# Patient Record
Sex: Female | Born: 1980 | Race: Black or African American | Hispanic: No | Smoking: Current every day smoker
Health system: Southern US, Community
[De-identification: ages and names within clinical notes are randomized; demographics above are authoritative.]

## PROBLEM LIST (undated history)

## (undated) DIAGNOSIS — R011 Cardiac murmur, unspecified: Secondary | ICD-10-CM

## (undated) DIAGNOSIS — K859 Acute pancreatitis without necrosis or infection, unspecified: Secondary | ICD-10-CM

## (undated) DIAGNOSIS — O009 Unspecified ectopic pregnancy without intrauterine pregnancy: Secondary | ICD-10-CM

## (undated) DIAGNOSIS — J45909 Unspecified asthma, uncomplicated: Secondary | ICD-10-CM

## (undated) HISTORY — PX: TUBAL LIGATION: SHX77

## (undated) HISTORY — PX: LAPAROSCOPIC UNILATERAL SALPINGECTOMY: SHX5934

## (undated) HISTORY — PX: SALPINGOOPHORECTOMY: SHX82

## (undated) HISTORY — PX: OTHER SURGICAL HISTORY: SHX169

---

## 2003-12-05 ENCOUNTER — Emergency Department (HOSPITAL_COMMUNITY): Admission: EM | Admit: 2003-12-05 | Discharge: 2003-12-05 | Payer: Self-pay | Admitting: Emergency Medicine

## 2004-03-07 ENCOUNTER — Emergency Department (HOSPITAL_COMMUNITY): Admission: EM | Admit: 2004-03-07 | Discharge: 2004-03-07 | Payer: Self-pay | Admitting: Emergency Medicine

## 2004-11-14 ENCOUNTER — Emergency Department (HOSPITAL_COMMUNITY): Admission: EM | Admit: 2004-11-14 | Discharge: 2004-11-14 | Payer: Self-pay | Admitting: Emergency Medicine

## 2005-03-28 ENCOUNTER — Emergency Department (HOSPITAL_COMMUNITY): Admission: EM | Admit: 2005-03-28 | Discharge: 2005-03-28 | Payer: Self-pay | Admitting: Emergency Medicine

## 2005-05-22 ENCOUNTER — Emergency Department (HOSPITAL_COMMUNITY): Admission: EM | Admit: 2005-05-22 | Discharge: 2005-05-22 | Payer: Self-pay | Admitting: Emergency Medicine

## 2005-06-01 ENCOUNTER — Emergency Department (HOSPITAL_COMMUNITY): Admission: EM | Admit: 2005-06-01 | Discharge: 2005-06-01 | Payer: Self-pay | Admitting: Emergency Medicine

## 2005-08-22 ENCOUNTER — Inpatient Hospital Stay (HOSPITAL_COMMUNITY): Admission: AD | Admit: 2005-08-22 | Discharge: 2005-08-22 | Payer: Self-pay | Admitting: Family Medicine

## 2005-08-23 ENCOUNTER — Ambulatory Visit: Payer: Self-pay | Admitting: Family Medicine

## 2005-08-23 ENCOUNTER — Inpatient Hospital Stay (HOSPITAL_COMMUNITY): Admission: AD | Admit: 2005-08-23 | Discharge: 2005-08-24 | Payer: Self-pay | Admitting: Obstetrics & Gynecology

## 2005-09-23 ENCOUNTER — Encounter: Payer: Self-pay | Admitting: Family Medicine

## 2005-09-23 ENCOUNTER — Ambulatory Visit: Payer: Self-pay | Admitting: Family Medicine

## 2005-10-07 ENCOUNTER — Ambulatory Visit: Payer: Self-pay | Admitting: Family Medicine

## 2005-10-07 ENCOUNTER — Ambulatory Visit (HOSPITAL_COMMUNITY): Admission: RE | Admit: 2005-10-07 | Discharge: 2005-10-07 | Payer: Self-pay | Admitting: Obstetrics & Gynecology

## 2005-10-28 ENCOUNTER — Ambulatory Visit: Payer: Self-pay | Admitting: Family Medicine

## 2005-11-03 ENCOUNTER — Inpatient Hospital Stay (HOSPITAL_COMMUNITY): Admission: AD | Admit: 2005-11-03 | Discharge: 2005-11-05 | Payer: Self-pay | Admitting: Obstetrics & Gynecology

## 2005-11-03 ENCOUNTER — Ambulatory Visit: Payer: Self-pay | Admitting: *Deleted

## 2005-11-04 ENCOUNTER — Ambulatory Visit: Payer: Self-pay | Admitting: Neonatology

## 2005-11-11 ENCOUNTER — Ambulatory Visit: Payer: Self-pay | Admitting: Family Medicine

## 2005-11-11 ENCOUNTER — Ambulatory Visit: Payer: Self-pay | Admitting: Obstetrics & Gynecology

## 2005-11-11 ENCOUNTER — Observation Stay (HOSPITAL_COMMUNITY): Admission: AD | Admit: 2005-11-11 | Discharge: 2005-11-12 | Payer: Self-pay | Admitting: Obstetrics & Gynecology

## 2005-11-25 ENCOUNTER — Ambulatory Visit: Payer: Self-pay | Admitting: Family Medicine

## 2005-12-09 ENCOUNTER — Ambulatory Visit: Payer: Self-pay | Admitting: Family Medicine

## 2005-12-12 ENCOUNTER — Ambulatory Visit: Payer: Self-pay | Admitting: Certified Nurse Midwife

## 2005-12-12 ENCOUNTER — Inpatient Hospital Stay (HOSPITAL_COMMUNITY): Admission: AD | Admit: 2005-12-12 | Discharge: 2005-12-12 | Payer: Self-pay | Admitting: Obstetrics and Gynecology

## 2005-12-13 ENCOUNTER — Ambulatory Visit: Payer: Self-pay | Admitting: Obstetrics and Gynecology

## 2005-12-13 ENCOUNTER — Observation Stay (HOSPITAL_COMMUNITY): Admission: AD | Admit: 2005-12-13 | Discharge: 2005-12-13 | Payer: Self-pay | Admitting: Gynecology

## 2005-12-23 ENCOUNTER — Ambulatory Visit (HOSPITAL_COMMUNITY): Admission: RE | Admit: 2005-12-23 | Discharge: 2005-12-23 | Payer: Self-pay | Admitting: Obstetrics and Gynecology

## 2005-12-23 ENCOUNTER — Ambulatory Visit: Payer: Self-pay | Admitting: *Deleted

## 2005-12-31 ENCOUNTER — Inpatient Hospital Stay (HOSPITAL_COMMUNITY): Admission: AD | Admit: 2005-12-31 | Discharge: 2005-12-31 | Payer: Self-pay | Admitting: Obstetrics and Gynecology

## 2005-12-31 ENCOUNTER — Ambulatory Visit: Payer: Self-pay | Admitting: *Deleted

## 2006-01-06 ENCOUNTER — Ambulatory Visit: Payer: Self-pay | Admitting: Gynecology

## 2006-01-06 ENCOUNTER — Inpatient Hospital Stay (HOSPITAL_COMMUNITY): Admission: AD | Admit: 2006-01-06 | Discharge: 2006-01-08 | Payer: Self-pay | Admitting: Gynecology

## 2006-01-06 ENCOUNTER — Ambulatory Visit: Payer: Self-pay | Admitting: Obstetrics & Gynecology

## 2006-01-08 ENCOUNTER — Encounter (INDEPENDENT_AMBULATORY_CARE_PROVIDER_SITE_OTHER): Payer: Self-pay | Admitting: *Deleted

## 2006-07-12 ENCOUNTER — Emergency Department (HOSPITAL_COMMUNITY): Admission: EM | Admit: 2006-07-12 | Discharge: 2006-07-12 | Payer: Self-pay | Admitting: Emergency Medicine

## 2006-07-16 ENCOUNTER — Emergency Department (HOSPITAL_COMMUNITY): Admission: EM | Admit: 2006-07-16 | Discharge: 2006-07-16 | Payer: Self-pay | Admitting: Emergency Medicine

## 2006-08-11 IMAGING — US US OB FOLLOW-UP
1 series · 13 of 28 positions shown · non-contrast
Comparison: 10/07/05
 Number of Fetuses: 1
 Heart Rate:  139
 Movement:  Yes
 Breathing:  No
 Presentation:  Cephalic
 Placental Location:  Posterior
 Grade:  I
 Previa:  No
 Amniotic Fluid (subjective):  Normal
 Amniotic Fluid (objective):  14.0 cm AFI (5th -95th%ile = 9.2 – 23.1 cm for 29 wks)

CLINICAL DATA: 24-year-old female with 29 week 5 day gestation.  Contractions and pelvic pain.  Reported history of low-lying placenta.  

 OBSTETRICAL ULTRASOUND RE-EVALUATION:

[Series 1: us ob follow-up · 0.33mm/px · 13 of 30 slices shown]
[im 2/30]
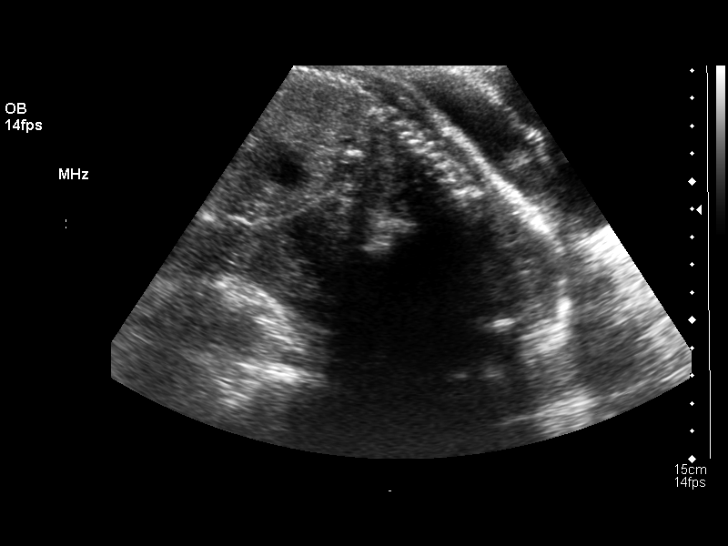
[im 4/30]
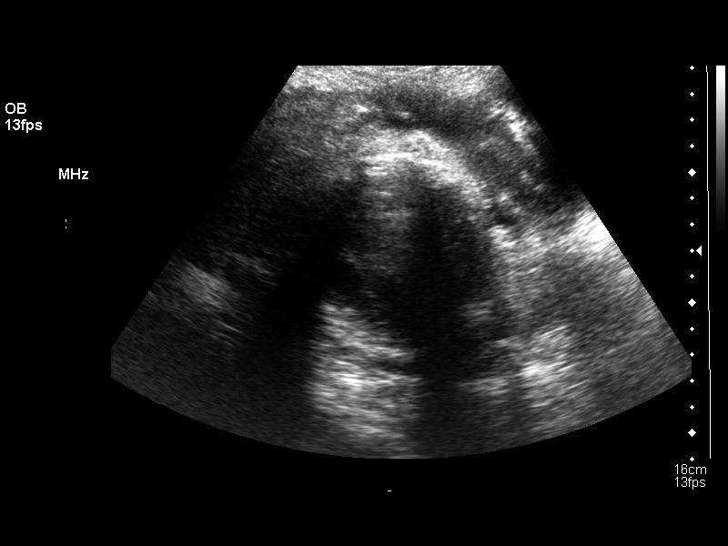
[im 6/30]
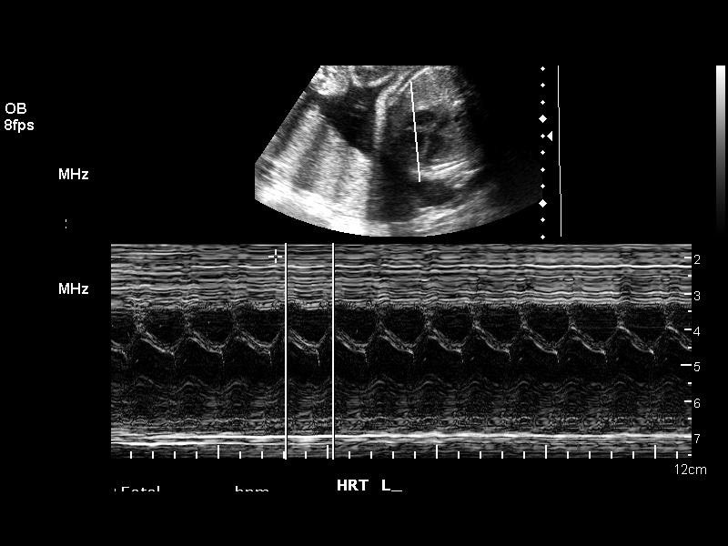
[im 8/30]
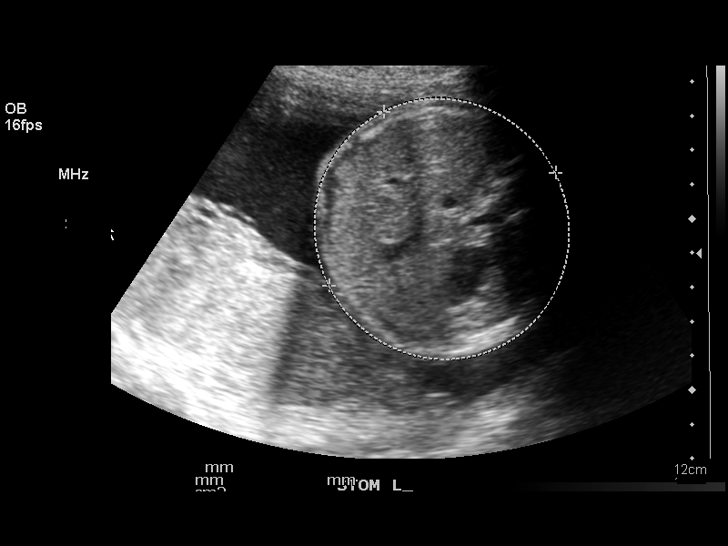
[im 10/30]
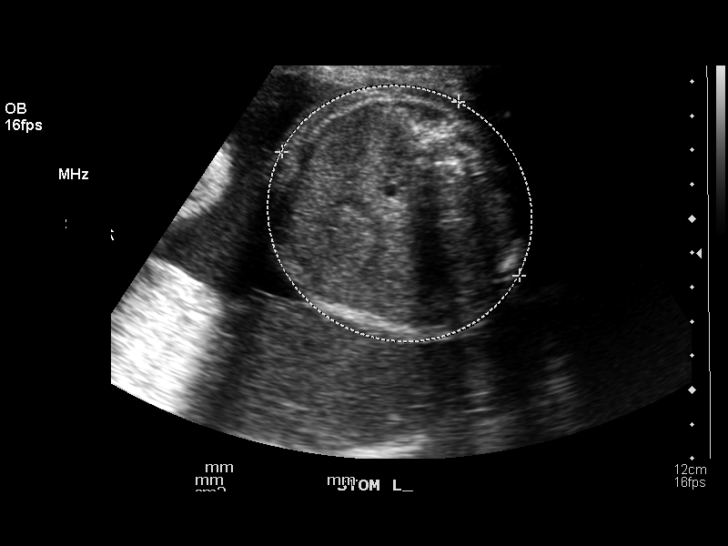
[im 12/30]
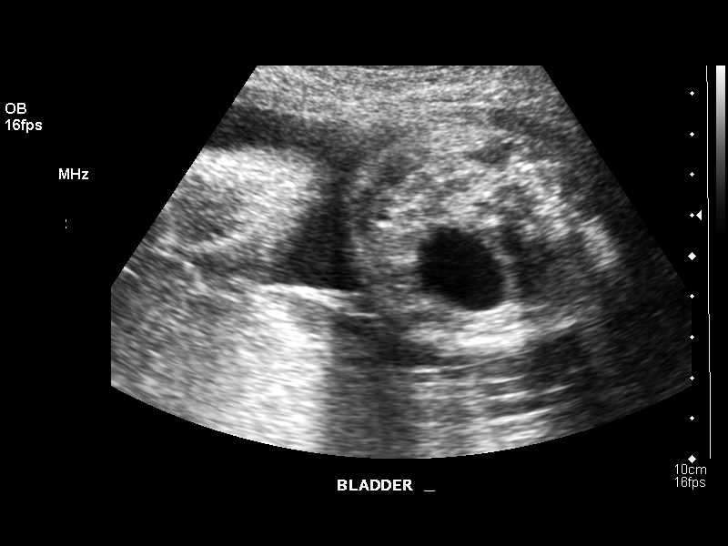
[im 16/30]
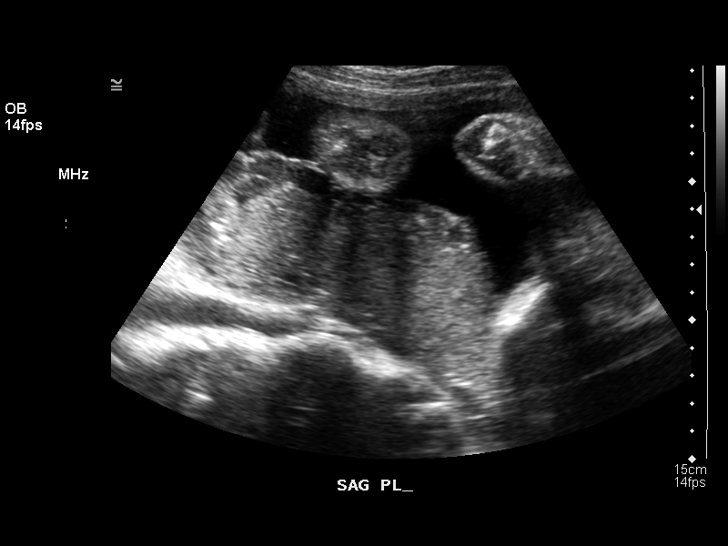
[im 18/30]
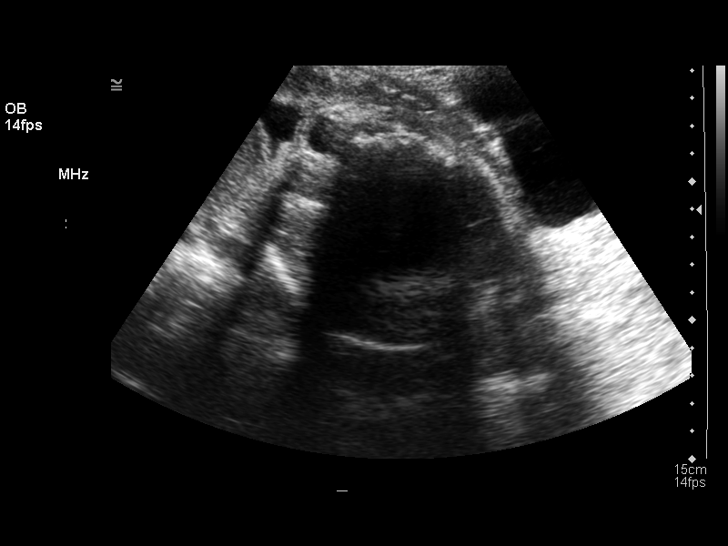
[im 20/30]
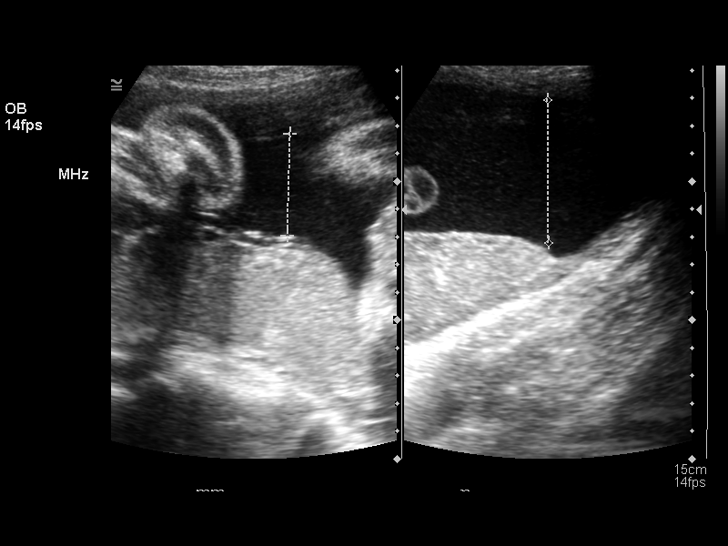
[im 22/30]
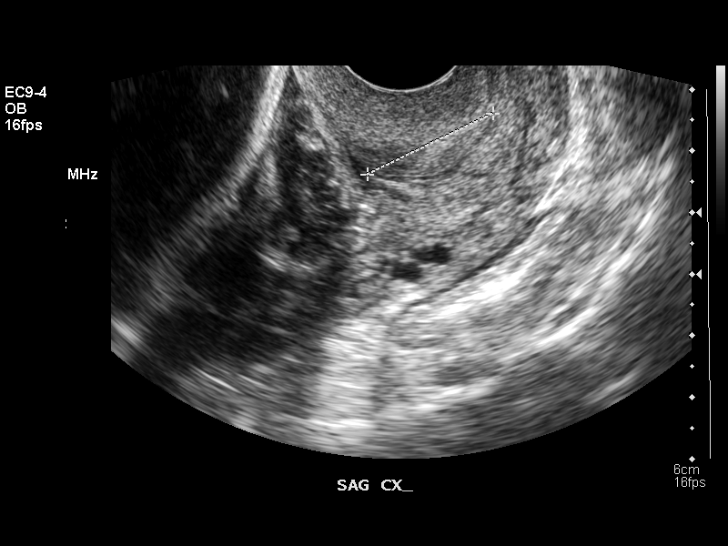
[im 24/30]
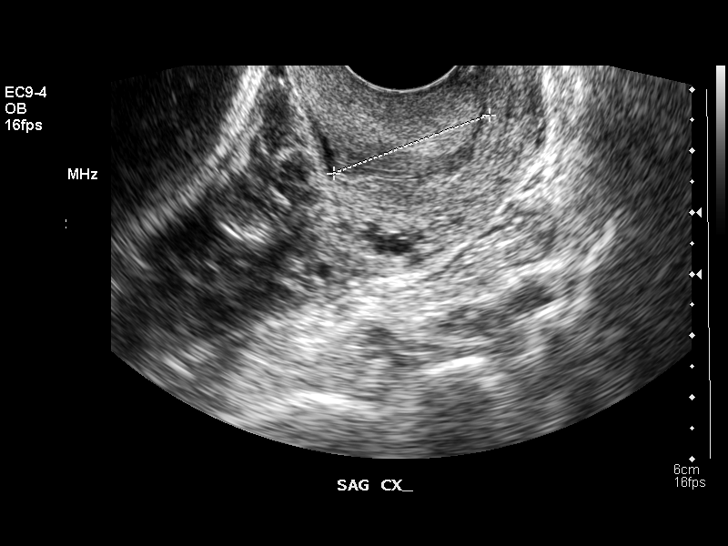
[im 26/30]
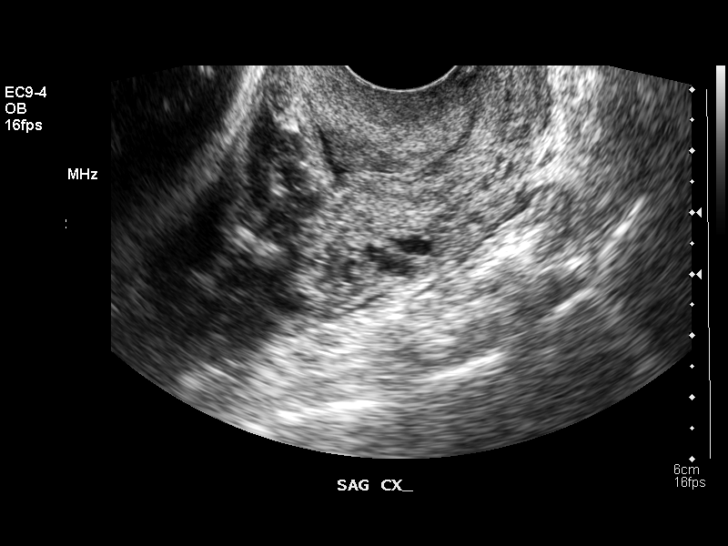
[im 28/30]
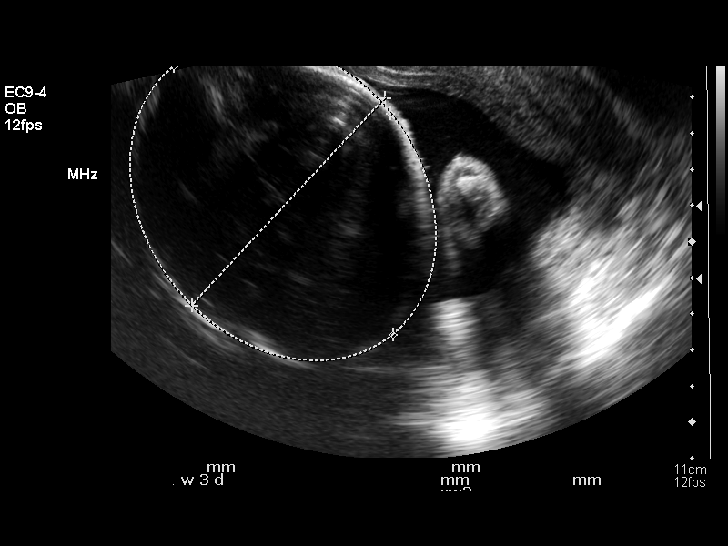

[13 of 28 positions shown; findings below may reference images not displayed]

FETAL BIOMETRY
 BPD:  6.4 cm   30 w 5 d
 HC:  27.1 cm   29 w 4 d
 AC:  23.7 cm   28 w 0 d
 FL:   5.3 cm   28 w 2 d

 Mean GA:  29 w 1 d  US EDC:  01/18/06
 Assigned GA:  28 w 5 d  Assigned EDC:  01/21/06

 EFW:  2224 g (H) 50th – 75th%ile (9900 – 4381 g) For 29 wks

 FETAL ANATOMY
 Lateral Ventricles:  Previously seen 
 Thalami/CSP:  Previously seen 
 Posterior Fossa:  Previously seen 
 Nuchal Region:  Previously seen 
 Spine:  Previously seen 
 4 Chamber Heart on Left:  Visualized 
 Stomach on Left:  Visualized 
 3 Vessel Cord:  Previously seen 
 Cord Insertion Site:  Previously seen 
 Kidneys:  Visualized 
 Bladder:  Visualized 
 Extremities:  Previously seen 

 MATERNAL UTERINE AND ADNEXAL FINDINGS
 Cervix: 2.7 cm Transvaginally
IMPRESSION: 1.  Single living intrauterine gestation in cephalic presentation with assigned gestational age of 28 weeks 5 days and estimated gestational age by this ultrasound of 29 weeks 1 day.  
 2.  Posterior placenta without evidence of previa.
 3.  Normal amniotic fluid volume.
 4.  Cervical length 2.7 cm.

## 2007-06-23 ENCOUNTER — Emergency Department (HOSPITAL_COMMUNITY): Admission: EM | Admit: 2007-06-23 | Discharge: 2007-06-23 | Payer: Self-pay | Admitting: Emergency Medicine

## 2008-11-21 ENCOUNTER — Emergency Department (HOSPITAL_COMMUNITY): Admission: EM | Admit: 2008-11-21 | Discharge: 2008-11-21 | Payer: Self-pay | Admitting: Emergency Medicine

## 2008-12-23 ENCOUNTER — Emergency Department (HOSPITAL_COMMUNITY): Admission: EM | Admit: 2008-12-23 | Discharge: 2008-12-23 | Payer: Self-pay | Admitting: Emergency Medicine

## 2010-09-02 LAB — POCT URINALYSIS DIP (DEVICE)
Glucose, UA: NEGATIVE mg/dL
Hgb urine dipstick: NEGATIVE
Urobilinogen, UA: 4 mg/dL — ABNORMAL HIGH (ref 0.0–1.0)
pH: 6.5 (ref 5.0–8.0)

## 2010-09-02 LAB — WET PREP, GENITAL: Yeast Wet Prep HPF POC: NONE SEEN

## 2010-09-02 LAB — POCT PREGNANCY, URINE: Preg Test, Ur: NEGATIVE

## 2010-10-12 NOTE — Op Note (Signed)
NAMECAYLAH, Natalie [name] NO.:  [id number]   MEDICAL RECORD NO.:  [id number]          PATIENT TYPE:  INP   LOCATION:  9106                          FACILITY:  [facility]   PHYSICIAN:  Ginger Carne, MD  DATE OF BIRTH:  September 03, 1980   DATE OF PROCEDURE:  01/08/2006  DATE OF DISCHARGE:  01/08/2006                               OPERATIVE REPORT   PREOPERATIVE DIAGNOSIS:  Desire for permanent sterilization.   POSTOPERATIVE DIAGNOSIS:  Desire for permanent sterilization.   PROCEDURE:  Postpartum bilateral tubal ligation using modified Pomeroy  technique.   SURGEON:  Blima Rich, M.D.   ASSISTANT:  Carolanne Grumbling, M.D.   ESTIMATED BLOOD LOSS:  Minimal.   ANESTHESIA:  General.   SPECIMENS:  Portions of bilateral tubes were sent to pathology.   PROCEDURE:  The patient was taken to the operating room where she was  placed under general anesthesia.  She was prepped and draped in the  usual sterile fashion.  An approximate 3-cm infraumbilical vertical skin  incision was made and carried through until the abdomen was entered.  Left fallopian tube was grasped with a Babcock and brought out of the  abdomen, followed out to the fimbriae.  Next, a loop of the mid portion  of the fallopian tube was ligated x2 and excised.  Ends of the fallopian  tube were cauterized.  Excellent hemostasis was maintained.  Fallopian  tube was returned to the abdomen.  Next, attention was turned to the  right side.  In a similar fashion, the right fallopian tube was grasped  with a Babcock clamp, brought out of the abdomen and followed out to the  fimbriae.  Next, an approximate 3-cm loop of fallopian tube was ligated  x2 and excised, and ends of the ligated fallopian tube were cauterized.  Excellent hemostasis was attained.  Fallopian tube was returned to the  abdomen.  Next, fascia was closed in a running fashion.  Skin was closed  with Monocryl.   The patient tolerated the procedure well.   There were no complications.    ______________________________  [name], M.D.      Ginger Carne, MD  Electronically Signed   TLW/MEDQ  D:  06/22/2006  T:  06/22/2006  Job:  [id number]

## 2010-10-12 NOTE — Discharge Summary (Signed)
[name], Natalie [name]               ACCOUNT NO.:  [id number]   MEDICAL RECORD NO.:  [id number]          PATIENT TYPE:  INP   LOCATION:  9156                          [facility]  [facility]   PHYSICIAN:  Tracy L. Mayford Knife, M.D.DATE OF BIRTH:  03-04-1981   DATE OF ADMISSION:  11/03/2005  DATE OF DISCHARGE:                                 DISCHARGE SUMMARY   ADDENDUM:  The patient will be discharged to home on Procardia XL 30 mg one  tablet p.o. q.12h.           ______________________________  Marc Morgans. Mayford Knife, M.D.     TLW/MEDQ  D:  11/05/2005  T:  11/05/2005  Job:  [id number]

## 2010-10-12 NOTE — Discharge Summary (Signed)
[name], [name]               ACCOUNT NO.:  [id number]   MEDICAL RECORD NO.:  [id number]          PATIENT TYPE:  INP   LOCATION:  9156                          [facility]   PHYSICIAN:  Tracy L. Mayford Knife, M.D.DATE OF BIRTH:  1981-05-10   DATE OF ADMISSION:  11/03/2005  DATE OF DISCHARGE:  11/05/2005                                 DISCHARGE SUMMARY   DISCHARGE ATTENDING:  Ginger Carne, MD.   REASON FOR ADMISSION:  Preterm labor.   DISCHARGE DIAGNOSES:  1.  A 28 week 6 day intrauterine pregnancy with preterm contractions.  2.  Bipolar.  3.  Permanent sterilization desired postpartum.  4.  Substance abuse.   LABORATORY DATA:  Fetal fibronectin negative, urine drug screen positive for  marijuana. Group B strep pending.   HOSPITAL COURSE:  The patient is a 30 year old, G6, P1-2-2-3 at 82 and [redacted]  weeks gestational age dated by her LMP consistent with a 19 week ultrasound  who presented with painful contractions. The patient was found to be dilated  1 cm, cervix felt to be approximately 2 cm long and -2 to -1 station. The  patient was having strong contractions every 2 minutes. She was given IV  fluids and started on Procardia, ampicillin and was admitted for  observation. The patient's contractions initially improved with the  Procardia but got more painful and was eventually switched to magnesium.  This was able to be discontinued and on date of discharge, her cervix was 1  cm long and high.   The patient is also noted to have bipolar. She is not interested in talking  with psychiatrist at this point. She feels that she has been stable for the  past three years. She admits to self medicating with marijuana. She does  feel like she has a supportive husband and his family is also supportive.   DISPOSITION:  Home.   FOLLOWUP:  High risk clinic November 11, 2005.   DIET:  Regular.   ACTIVITY:  Bed rest.   MEDICATIONS:  Resume home meds.     ______________________________  [name], M.D.     TLW/MEDQ  D:  11/05/2005  T:  11/05/2005  Job:  [id number]

## 2010-10-12 NOTE — Discharge Summary (Signed)
NAMEREBECCAH, [name]               ACCOUNT NO.:  [id number]   MEDICAL RECORD NO.:  [id number]          PATIENT TYPE:  INP   LOCATION:  9153                          [facility]   PHYSICIAN:  Lesly Dukes, M.D. DATE OF BIRTH:  1980/09/13   DATE OF ADMISSION:  08/23/2005  DATE OF DISCHARGE:  08/24/2005                                 DISCHARGE SUMMARY   HISTORY OF PRESENT ILLNESS:  The patient is a 30 year old, G6, P1-2-2-3 at  18-3/7 weeks who was seen in the MAU on March 29, and found to have a  marginal previa and had bleeding 2 days before bleeding which she states was  spotting on toilet paper x3 which resolved on its own.  The patient was  instructed on March 29, to stay and be admitted.  The patient had to leave  for child care issues and left AMA.  She returned on March 30, to be  admitted.   MEDICATIONS:  1.  Prenatal vitamins.  2.  Albuterol.   PAST OBSTETRICAL HISTORY:  G1 was an ectopic with methotrexate; G2 was a  spontaneous AB at 12 weeks; G3 was a 42-week, 5 pound 4 ounce, SVD.  The  patient states she had PPROM at 6 months, but on bed rest at home; G4 was a  34-week SVD at 5 pounds 5.5 ounces.  The patient received steroids and  tocolytics at [facility]; G5 was a 36-week, 6 pound 13 ounce  SVD, bed rest after 12 weeks for bleeding.  The patient states that with all  three children she had placenta previa that resolved by the time she  delivered.   PAST GYNECOLOGICAL HISTORY:  Trichomonas in the past.   PAST MEDICAL HISTORY:  Asthma.   PAST SURGICAL HISTORY:  1.  Knee surgery.  2.  T&A.   SOCIAL HISTORY:  Marijuana use.  Tobacco with 3-5 cigarettes a day.  No  ethanol.   PHYSICAL EXAMINATION:  VITAL SIGNS:  Temperature 99.5, pulse 74,  respirations 20, blood pressure 103/51.  GENERAL:  Well-developed, well-nourished female in no acute distress.  CARDIAC:  Normal S1, S2, no murmurs, rubs or gallops.  CHEST:  Clear to auscultation  bilaterally.  ABDOMEN:  Gravid.  The patient was also noted on ultrasound to have a  dynamic cervix which went from 5 cm to 2.2 cm.   HOSPITAL COURSE:  The patient was admitted.  She was put on bed rest.  She  was started on ampicillin and Flagyl and Indocin.  She remained stable and  is having no bleeding.  The ultrasound was reviewed by Dr. Mia Creek and  actual length was found to be 2.9 cm and the funneling was only 4 m.  It was  felt, therefore, that the patient did not actually have a dynamic cervix and  could go home.   LABORATORY DATA AND X-RAY FINDINGS:  Urine drug screen positive for THC.  GC  and Chlamydia negative.  Wet-prep was negative.  Sickle cell test was  negative.  OB panel with hemoglobin of 8.9, platelets 189 and white count  11.2.  RPR was nonreactive.  Hepatitis B was negative.  Rubella immune.  Blood type A positive.  UA negative.   Tests pending at the time of discharge include a TSH, ferritin and  hemoglobin electrophoresis.  A quad screen was ordered, but was unable to be  drawn and ordered in this clinic per the lab technician who stated it was  not possible to order it.   DISPOSITION:  She was discharged to home.   ACTIVITY:  She was to refrain from strenuous activity.  She was to have no  intercourse until further evaluation.   DISCHARGE MEDICATIONS:  1.  Protonix.  2.  Prenatal vitamins.   FOLLOW UP:  Follow up in high-risk clinic.  She will be called for an  appointment.     ______________________________  August Saucer. Merlene Morse, MD    ______________________________  Lesly Dukes, M.D.    ABC/MEDQ  D:  08/24/2005  T:  08/24/2005  Job:  [id number]

## 2010-10-12 NOTE — Discharge Summary (Signed)
Natalie [name], Natalie [name] NO.:  [id number]   MEDICAL RECORD NO.:  [id number]          PATIENT TYPE:  INP   LOCATION:  9153                          [facility]ILITY[facility]   PHYSICIAN:  Lesly Dukes, M.D. DATE OF BIRTH:  December 06, 1980   DATE OF ADMISSION:  08/23/2005  DATE OF DISCHARGE:  08/24/2005                                 DISCHARGE SUMMARY   ADDENDUM:  The patient had TSH, ferritin, hemoglobin electrophoresis, and quad screen  ordered at the time of discharge but the patient left before the blood could  be drawn.  The patient was called and told to come back to the lab to have  these studies drawn as soon as possible.     ______________________________  Karoline Caldwell B. Merlene Morse, MD    ______________________________  Lesly Dukes, M.D.    ABC/MEDQ  D:  08/24/2005  T:  08/26/2005  Job:  [id number]

## 2010-10-12 NOTE — Discharge Summary (Signed)
NAMEMALAYASIA, Natalie [name]               ACCOUNT NO.:  [id number]   MEDICAL RECORD NO.:  [id number]         PATIENT TYPE:  WOBV   LOCATION:                                [facility]   PHYSICIAN:  Tracy L. Mayford Knife, M.D.DATE OF BIRTH:  31-Dec-1980   DATE OF ADMISSION:  11/11/2005  DATE OF DISCHARGE:  11/12/2005                                 DISCHARGE SUMMARY   REASON FOR ADMISSION:  Rule out preterm labor.   DISCHARGE DIAGNOSES:  1.  Preterm contractions.  2.  Mental illness (history of bipolar/schizophrenia).   HOSPITAL COURSE:  The patient is a 30 year old G8, P1-2-2-3 and 49 and 6/[redacted]  weeks gestational age who presented complaining of contractions for 2 hours  every 1 to 2 minutes.  She also complained of pain with urination and of  pain coming out of her vagina.  On admission, serial vaginal exam was 180, -  2, and she was found to be contracting.  This exam was different from her  previous exam, so she was admitted for observation.  She had IV fluids and  was continued on Procardia.  The patient responded well to the IV fluids and  stopped contracting.  On the day of discharge, her exam was 150, -2.  She  did admit that her house does not have air conditioning and that she is not  drinking as much as she needs to.   DISPOSITION:  Home.   DISCHARGE INSTRUCTIONS:  The patient is to stay in an air conditioned place  and increase her fluid intake.  Preterm labor precautions were provided.   MEDICATIONS:  Procardia XL 30 mg q.12 h.   DIET:  Regular.   FOLLOWUP:  At high risk clinic as previously scheduled.           ______________________________  [name], M.D.     TLW/MEDQ  D:  11/12/2005  T:  11/12/2005  Job:  [id number]

## 2021-09-12 ENCOUNTER — Emergency Department (HOSPITAL_COMMUNITY)
Admission: EM | Admit: 2021-09-12 | Discharge: 2021-09-12 | Disposition: A | Discharge status: Home / Self Care | Payer: Self-pay | Attending: Emergency Medicine | Admitting: Emergency Medicine

## 2021-09-12 ENCOUNTER — Encounter (HOSPITAL_COMMUNITY): Payer: Self-pay | Admitting: Emergency Medicine

## 2021-09-12 ENCOUNTER — Emergency Department (HOSPITAL_COMMUNITY): Payer: Self-pay

## 2021-09-12 ENCOUNTER — Other Ambulatory Visit: Payer: Self-pay

## 2021-09-12 DIAGNOSIS — Y92009 Unspecified place in unspecified non-institutional (private) residence as the place of occurrence of the external cause: Secondary | ICD-10-CM | POA: Insufficient documentation

## 2021-09-12 DIAGNOSIS — X501XXA Overexertion from prolonged static or awkward postures, initial encounter: Secondary | ICD-10-CM | POA: Insufficient documentation

## 2021-09-12 DIAGNOSIS — Y9301 Activity, walking, marching and hiking: Secondary | ICD-10-CM | POA: Insufficient documentation

## 2021-09-12 DIAGNOSIS — S93402A Sprain of unspecified ligament of left ankle, initial encounter: Secondary | ICD-10-CM | POA: Insufficient documentation

## 2021-09-12 DIAGNOSIS — M79642 Pain in left hand: Secondary | ICD-10-CM | POA: Insufficient documentation

## 2021-09-12 DIAGNOSIS — R202 Paresthesia of skin: Secondary | ICD-10-CM | POA: Insufficient documentation

## 2021-09-12 DIAGNOSIS — M7989 Other specified soft tissue disorders: Secondary | ICD-10-CM | POA: Insufficient documentation

## 2021-09-12 DIAGNOSIS — Z91040 Latex allergy status: Secondary | ICD-10-CM | POA: Insufficient documentation

## 2021-09-12 DIAGNOSIS — J45909 Unspecified asthma, uncomplicated: Secondary | ICD-10-CM | POA: Insufficient documentation

## 2021-09-12 HISTORY — DX: Unspecified asthma, uncomplicated: J45.909

## 2021-09-12 HISTORY — DX: Unspecified ectopic pregnancy without intrauterine pregnancy: O00.90

## 2021-09-12 MED ORDER — IBUPROFEN 800 MG PO TABS
800.0000 mg | ORAL_TABLET | Freq: Once | ORAL | Status: AC
  Administered 2021-09-12: 800 mg via ORAL
  Filled 2021-09-12: qty 1

## 2021-09-12 MED ORDER — DEXAMETHASONE 4 MG PO TABS
8.0000 mg | ORAL_TABLET | Freq: Once | ORAL | Status: AC
  Administered 2021-09-12: 8 mg via ORAL
  Filled 2021-09-12: qty 2

## 2021-09-12 MED ORDER — ACETAMINOPHEN 500 MG PO TABS
1000.0000 mg | ORAL_TABLET | Freq: Once | ORAL | Status: AC
  Administered 2021-09-12: 1000 mg via ORAL
  Filled 2021-09-12: qty 2

## 2021-09-12 NOTE — ED Provider Notes (Addendum)
40yo RHD female to ED with hand pain/ ankle pain. See PA note. Patient with left hand swelling, discomfort.  Onset last night.  No medications prior to arrival.  Swelling to her third and fourth digit approximately.  She has mild paresthesias to the median nerve distribution.  Positive Phalen's test.  I have very low suspicion for infectious etiology.  Afebrile.  XR neg. No erythema.  No skin trauma.  Cap refill is brisk. NV intact. No reported trauma to the hand.  Will give steroids, Motrin, Tylenol, wrist brace.  Have her follow-up with Ortho, possible CTS.  ? ?See PA note from day of service for further details.  ? ? ?  ?Sloan Leiter, DO ?09/12/21 1207 ? ?  ?Sloan Leiter, DO ?09/12/21 1208 ? ?

## 2021-09-12 NOTE — ED Triage Notes (Signed)
Patient arrives ambulatory c/o left ankle pain x 3 days after tripping on a rock. States she was resting it and felt better but now hurting again after working. Also having left handed pain- primarily the left middle and ring finger onset of yesterday. Patient denies any injury.  ?

## 2021-09-12 NOTE — Discharge Instructions (Addendum)
[facility] today to be evaluated for your left ankle and left hand pain.  The x-rays obtained did not show any broken bones or dislocations.  It is likely that you have sprained your left ankle; due to this you will put in a Cam walking boot and given crutches.  You may weight-bear as tolerated. ? ?Your left hand pain may be due to carpal tunnel.  Please use the brace as discussed earlier. ? ?Please call [facility] to schedule a follow-up appointment for your hand and ankle. ? ?Please take Ibuprofen (Advil, motrin) and Tylenol (acetaminophen) to relieve your pain.   ? ?You may take up to 600 MG (3 pills) of normal strength ibuprofen every 8 hours as needed.   ?You make take tylenol, up to 1,000 mg (two extra strength pills) every 8 hours as needed.  ? ?It is safe to take ibuprofen and tylenol at the same time as they work differently.  ? Do not take more than 3,000 mg tylenol in a 24 hour period (not more than one dose every 8 hours.  Please check all medication labels as many medications such as pain and cold medications may contain tylenol.  Do not drink alcohol while taking these medications.  Do not take other NSAID'S while taking ibuprofen (such as aleve or naproxen).  Please take ibuprofen with food to decrease stomach upset. ? ?Get help right away if: ?Your foot or toes become numb or blue. ?Your hand or finger become numb or blue. ?You have severe pain that gets worse. ?

## 2021-09-12 NOTE — ED Provider Notes (Signed)
?[facility] EMERGENCY DEPARTMENT ?Provider Note ? ? ?CSN: [id number] ?Arrival date & time: 09/12/21  3419 ? ?  ? ?History ? ?Chief Complaint  ?Patient presents with  ? Ankle Pain  ? Hand Pain  ? ? ?Natalie [name] is a 41 y.o. female with pertinent past history of asthma.  Presents to the emergency department with a chief complaint of left hand and left ankle pain. ? ?Patient states that last Tuesday, 09/04/2021, she rolled her left ankle causing her to fall at ground level.  Patient states that ankle was inverted.  Patient states that she did this twice while walking home.  Patient denies hitting her head or any loss of consciousness.  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   ? ?Patient states that she started having tingling sensation to her fourth and third fingers on the left hand yesterday.  Patient states that she woke up this morning at 3 AM with pain and swelling to third and fourth fingers.  Patient reports that pain is constant however waxes and wanes in intensity.  Patient swelling has gotten progressively worse.  Patient denies any known traumatic injuries. ? ?Denies any fevers, chills, numbness, weakness, pallor, color change, wound, neck pain, back pain, saddle anesthesia, bowel or bladder dysfunction, IV drug use. ? ? ?Ankle Pain ?Associated symptoms: no back pain, no fever and no neck pain   ?Hand Pain ? ? ?  ? ?Home Medications ?Prior to Admission medications   ?Not on File  ?   ? ?Allergies    ?Flagyl [metronidazole], Iodine, and Latex   ? ?Review of Systems   ?Review of Systems  ?Constitutional:  Negative for fever.  ?HENT:  Negative for facial swelling.   ?Eyes:  Negative for visual disturbance.  ?Musculoskeletal:  Positive for arthralgias and joint swelling. Negative for back pain and neck pain.  ?Skin:  Negative for color change, pallor,  rash and wound.  ?Neurological:  Negative for weakness and numbness.  ? ?Physical Exam ?Updated Vital Signs ?BP (!) 154/109 (BP Location: Right Arm)   Pulse 84   Temp 99.1 ?F (37.3 ?C) (Oral)   Resp 17   Ht 5' 9.5" (1.765 m)   Wt 71.7 kg   LMP 09/06/2021   SpO2 97%   BMI 23.00 kg/m?  ?Physical Exam ?Vitals and nursing note reviewed.  ?Constitutional:   ?   General: She is not in acute distress. ?   Appearance: She is not ill-appearing, toxic-appearing or diaphoretic.  ?HENT:  ?   Head: Normocephalic.  ?Eyes:  ?   General: No scleral icterus.    ?   Right eye: No discharge.     ?   Left eye: No discharge.  ?Cardiovascular:  ?   Rate and Rhythm: Normal rate.  ?   Pulses:     ?     Radial pulses are 2+ on the right side and 2+ on the left side.  ?     Dorsalis pedis pulses are 2+ on the right side and 2+ on the left side.  ?Pulmonary:  ?   Effort: Pulmonary effort is normal.  ?Musculoskeletal:  ?   Right forearm: Normal.  ?   Left forearm: Normal.  ?   Right wrist: Normal.  ?   Left wrist: Normal.  ?   Right hand: No swelling, deformity,  lacerations, tenderness or bony tenderness. Normal range of motion. Normal sensation. Normal capillary refill. Normal pulse.  ?   Left hand: Swelling, tenderness and bony tenderness present. No deformity or lacerations. Decreased range of motion. Decreased sensation. Normal capillary refill. Normal pulse.  ?   Right lower leg: Normal.  ?   Left lower leg: Normal.  ?   Right ankle: No swelling, deformity, ecchymosis or lacerations. No tenderness. Normal range of motion.  ?   Left ankle: Swelling present. No deformity, ecchymosis or lacerations. Tenderness present over the lateral malleolus. No medial malleolus tenderness. Normal range of motion.  ?   Right foot: Normal range of motion and normal capillary refill. No swelling, deformity, laceration, tenderness, bony tenderness or crepitus. Normal pulse.  ?   Left foot: Normal range of motion and normal capillary refill. No  swelling, deformity, laceration, tenderness, bony tenderness or crepitus. Normal pulse.  ?   Comments: Swelling and tenderness to left lateral malleus. ? ?Swelling to dorsum of left hand to second through fourth MCP joints and fingers.  Decree sensation to second through fourth fingers.  Decreased range of motion to flexion third and fourth fingers.  Cap refill less than 2 seconds in all digits of left hand.  No erythema, wound, or rash noted to left hand. ? ?  ?Skin: ?   General: Skin is warm and dry.  ?Neurological:  ?   General: No focal deficit present.  ?   Mental Status: She is alert.  ?Psychiatric:     ?   Behavior: Behavior is cooperative.  ? ? ?ED Results / Procedures / Treatments   ?Labs ?(all labs ordered are listed, but only abnormal results are displayed) ?Labs Reviewed - No data to display ? ?EKG ?None ? ?Radiology ?DG Ankle Complete Left ? ?Result Date: 09/12/2021 ?CLINICAL DATA:  Left ankle pain after injury 3 days ago. EXAM: LEFT ANKLE COMPLETE - 3+ VIEW COMPARISON:  None. FINDINGS: There is no evidence of fracture, dislocation, or joint effusion. There is no evidence of arthropathy or other focal bone abnormality. Lateral soft tissue swelling is noted. IMPRESSION: No fracture or dislocation is noted. Lateral soft tissue swelling is noted. Electronically Signed   By: Lupita RaiderJames  Green Jr M.D.   On: 09/12/2021 09:25  ? ?DG Hand Complete Left ? ?Result Date: 09/12/2021 ?CLINICAL DATA:  Left hand pain after injury 3 days ago. EXAM: LEFT HAND - COMPLETE 3+ VIEW COMPARISON:  None. FINDINGS: There is no evidence of fracture or dislocation. There is no evidence of arthropathy or other focal bone abnormality. Soft tissues are unremarkable. IMPRESSION: Negative. Electronically Signed   By: Lupita RaiderJames  Green Jr M.D.   On: 09/12/2021 09:24   ? ?Procedures ?Procedures  ? ? ?Medications Ordered in ED ?Medications  ?acetaminophen (TYLENOL) tablet 1,000 mg (1,000 mg Oral Given 09/12/21 1137)  ?ibuprofen (ADVIL) tablet 800 mg  (800 mg Oral Given 09/12/21 1224)  ?dexamethasone (DECADRON) tablet 8 mg (8 mg Oral Given 09/12/21 1224)  ? ? ?ED Course/ Medical Decision Making/ A&P ?  ?                        ?Medical Decision Making ?Amount and/or Complexity of Data Reviewed ?Radiology: ordered. ? ?Risk ?OTC drugs. ?Prescription drug management. ? ? ?Alert 41 year old female in no acute distress, nontoxic-appearing.  Presents to the ED with a complaint of left hand and left ankle pain. ? ?Information obtained from patient.  Past medical records were  reviewed including previous provider notes, labs, and imaging.  Patient has medical history as outlined in HPI which complicates her care. ? ?Due to reports of pain x-ray imaging was obtained while patient was in triage.  I personally viewed interpret patient's x-ray imaging.  Agree with radiology interpretation of no acute osseous or soft tissue swelling to left hand.  No fracture or dislocation of the left ankle.  Lateral soft tissue swelling is noted. ? ?Due to patient's mechanism of injury suspects ankle sprain.  Will place patient in cam walking boot and make weightbearing as tolerated.  Patient given crutches to use as needed.  Patient will need to follow-up with orthopedic provider in the outpatient setting. ? ?No signs of infection to hand, concern for possible carpal tunnel syndrome.  Will place patient in wrist brace and have her follow-up with orthopedic hand specialist in outpatient setting. ? ?Patient reports improvement in pain after receiving Tylenol.  Patient hemodynamically stable.  Will discharge patient at this time.  Discussed symptomatic treatment with rest, ice, elevation, and over-the-counter pain medication. ? ?Patient was discussed with and evaluated by Dr. Wallace Cullens. ? ?Based on patient's chief complaint, I considered admission might be necessary, however after reassuring ED workup feel patient is reasonable for discharge.  Discussed results, findings, treatment and follow up.  Patient advised of return precautions. Patient verbalized understanding and agreed with plan. ? ?Portions of this note were generated with Scientist, clinical (histocompatibility and immunogenetics). Dictation errors may occur despite best attempts at

## 2021-10-20 ENCOUNTER — Encounter (HOSPITAL_COMMUNITY): Payer: Self-pay | Admitting: Emergency Medicine

## 2021-10-20 ENCOUNTER — Other Ambulatory Visit: Payer: Self-pay

## 2021-10-20 ENCOUNTER — Inpatient Hospital Stay (HOSPITAL_COMMUNITY)
Admission: EM | Admit: 2021-10-20 | Discharge: 2021-10-24 | DRG: 440 | Disposition: A | Discharge status: Home / Self Care | Payer: Self-pay | Attending: Internal Medicine | Admitting: Internal Medicine

## 2021-10-20 ENCOUNTER — Emergency Department (HOSPITAL_COMMUNITY): Payer: Self-pay

## 2021-10-20 DIAGNOSIS — Z91040 Latex allergy status: Secondary | ICD-10-CM

## 2021-10-20 DIAGNOSIS — F1010 Alcohol abuse, uncomplicated: Secondary | ICD-10-CM

## 2021-10-20 DIAGNOSIS — F17210 Nicotine dependence, cigarettes, uncomplicated: Secondary | ICD-10-CM | POA: Diagnosis present

## 2021-10-20 DIAGNOSIS — K852 Alcohol induced acute pancreatitis without necrosis or infection: Principal | ICD-10-CM | POA: Diagnosis present

## 2021-10-20 DIAGNOSIS — K7010 Alcoholic hepatitis without ascites: Secondary | ICD-10-CM

## 2021-10-20 DIAGNOSIS — K700 Alcoholic fatty liver: Secondary | ICD-10-CM | POA: Diagnosis present

## 2021-10-20 DIAGNOSIS — K760 Fatty (change of) liver, not elsewhere classified: Secondary | ICD-10-CM

## 2021-10-20 DIAGNOSIS — Z888 Allergy status to other drugs, medicaments and biological substances status: Secondary | ICD-10-CM

## 2021-10-20 LAB — CBC
HCT: 49 % — ABNORMAL HIGH (ref 36.0–46.0)
Hemoglobin: 16.8 g/dL — ABNORMAL HIGH (ref 12.0–15.0)
MCH: 33.3 pg (ref 26.0–34.0)
MCHC: 34.3 g/dL (ref 30.0–36.0)
MCV: 97.2 fL (ref 80.0–100.0)
Platelets: 267 10*3/uL (ref 150–400)
RBC: 5.04 MIL/uL (ref 3.87–5.11)
RDW: 14 % (ref 11.5–15.5)
WBC: 14 10*3/uL — ABNORMAL HIGH (ref 4.0–10.5)
nRBC: 0 % (ref 0.0–0.2)

## 2021-10-20 LAB — COMPREHENSIVE METABOLIC PANEL
ALT: 86 U/L — ABNORMAL HIGH (ref 0–44)
AST: 157 U/L — ABNORMAL HIGH (ref 15–41)
Albumin: 4.5 g/dL (ref 3.5–5.0)
Alkaline Phosphatase: 136 U/L — ABNORMAL HIGH (ref 38–126)
Anion gap: 14 (ref 5–15)
BUN: <5 mg/dL — ABNORMAL LOW (ref 6–20)
CO2: 23 mmol/L (ref 22–32)
Calcium: 9.7 mg/dL (ref 8.9–10.3)
Chloride: 103 mmol/L (ref 98–111)
Creatinine, Ser: 0.87 mg/dL (ref 0.44–1.00)
GFR, Estimated: >60 mL/min (ref >60)
Glucose, Bld: 151 mg/dL — ABNORMAL HIGH (ref 70–99)
Potassium: 3.1 mmol/L — ABNORMAL LOW (ref 3.5–5.1)
Sodium: 140 mmol/L (ref 135–145)
Total Bilirubin: 1.5 mg/dL — ABNORMAL HIGH (ref 0.3–1.2)
Total Protein: 7.5 g/dL (ref 6.5–8.1)

## 2021-10-20 LAB — ETHANOL: Alcohol, Ethyl (B): <10 mg/dL (ref <10)

## 2021-10-20 LAB — I-STAT BETA HCG BLOOD, ED (MC, WL, AP ONLY): I-stat hCG, quantitative: <5 m[IU]/mL (ref <5)

## 2021-10-20 LAB — LIPASE, BLOOD: Lipase: 533 U/L — ABNORMAL HIGH (ref 11–51)

## 2021-10-20 LAB — MAGNESIUM: Magnesium: 1.7 mg/dL (ref 1.7–2.4)

## 2021-10-20 MED ORDER — LORAZEPAM 2 MG/ML IJ SOLN: 0.0000 mg | Freq: Two times a day (BID) | INTRAMUSCULAR | Status: DC

## 2021-10-20 MED ORDER — THIAMINE HCL 100 MG/ML IJ SOLN
100.0000 mg | Freq: Every day | INTRAMUSCULAR | Status: DC
  Administered 2021-10-21: 100 mg via INTRAVENOUS
  Filled 2021-10-20 (×2): qty 2

## 2021-10-20 MED ORDER — THIAMINE HCL 100 MG PO TABS
100.0000 mg | ORAL_TABLET | Freq: Every day | ORAL | Status: DC
  Administered 2021-10-20 – 2021-10-24 (×4): 100 mg via ORAL
  Filled 2021-10-20 (×4): qty 1

## 2021-10-20 MED ORDER — LORAZEPAM 1 MG PO TABS: 0.0000 mg | ORAL_TABLET | Freq: Two times a day (BID) | ORAL | Status: DC

## 2021-10-20 MED ORDER — HYDROMORPHONE HCL 1 MG/ML IJ SOLN
0.5000 mg | Freq: Once | INTRAMUSCULAR | Status: AC
  Administered 2021-10-20: 0.5 mg via INTRAVENOUS
  Filled 2021-10-20: qty 1

## 2021-10-20 MED ORDER — LORAZEPAM 1 MG PO TABS
0.0000 mg | ORAL_TABLET | Freq: Four times a day (QID) | ORAL | Status: DC
  Administered 2021-10-21: 1 mg via ORAL
  Filled 2021-10-20: qty 2

## 2021-10-20 MED ORDER — LACTATED RINGERS IV SOLN
INTRAVENOUS | Status: AC
Start: 2021-10-20 — End: 2021-10-22

## 2021-10-20 MED ORDER — ONDANSETRON HCL 4 MG/2ML IJ SOLN
4.0000 mg | Freq: Once | INTRAMUSCULAR | Status: AC | PRN
  Administered 2021-10-20: 4 mg via INTRAVENOUS
  Filled 2021-10-20: qty 2

## 2021-10-20 MED ORDER — POTASSIUM CHLORIDE CRYS ER 20 MEQ PO TBCR
40.0000 meq | EXTENDED_RELEASE_TABLET | Freq: Once | ORAL | Status: AC
  Administered 2021-10-20: 40 meq via ORAL
  Filled 2021-10-20: qty 2

## 2021-10-20 MED ORDER — HYDROMORPHONE HCL 1 MG/ML IJ SOLN
1.0000 mg | Freq: Once | INTRAMUSCULAR | Status: AC
  Administered 2021-10-20: 1 mg via INTRAVENOUS
  Filled 2021-10-20: qty 1

## 2021-10-20 MED ORDER — SODIUM CHLORIDE 0.9 % IV BOLUS
1000.0000 mL | Freq: Once | INTRAVENOUS | Status: AC
  Administered 2021-10-20: 1000 mL via INTRAVENOUS

## 2021-10-20 MED ORDER — LORAZEPAM 2 MG/ML IJ SOLN: 0.0000 mg | Freq: Four times a day (QID) | INTRAMUSCULAR | Status: DC

## 2021-10-20 NOTE — ED Notes (Signed)
Patient asked to provide urine sample/ patient reports awareness

## 2021-10-20 NOTE — Subjective & Objective (Signed)
CC: abd pain, N/V HPI: 41 year old African-American female history of alcohol abuse, history of alcoholic pancreatitis about 3 years ago, takes no chronic medications presents to the ER today with abdominal pain, nausea vomiting started today.  Patient drinks about 4 alcoholic drinks per day.  Yesterday she states that her brother came into town and she drank half a bottle of tequila with him.  She started having a severe abdominal pain along with nausea vomiting that started early in the morning.  Patient came to the ER for evaluation.  On arrival temp 98.3 heart rate 74 blood pressure 150/95 satting 90% on room air.   Laboratory evaluation:  Lipase 533 Alcohol less than 10 Sodium 140, chest 3.1, BUN of less than 5, creatinine 0.87 AST 157 ALT of 86 alk phos of 136 total bili 1.5  White count 14, hemoglobin 16.8, platelets 267  CT scan which I have reviewed and personally interpreted includes acute pancreatitis with peripancreatic edema.  No gallstones are noted.  Hepatic steatosis/fatty liver.   Patient received multiple doses of IV Dilaudid.  [facility] hospitalist contacted for admission.

## 2021-10-20 NOTE — ED Provider Notes (Incomplete)
[facility] EMERGENCY DEPARTMENT Provider Note   CSN: [id number] Arrival date & time: 10/20/21  1741     History {Add pertinent medical, surgical, social history, OB history to HPI:1} Chief Complaint  Patient presents with  . Abdominal Pain    Natalie [name] is a 41 y.o. female.  Patient is a 41 year old female with past medical history significant for pancreatitis.  She denies any other major medical problems.  She states she does not take any medications.  She states that she woke up this morning with severe epigastric pain.  She states is constant sharp boring and seems to radiate through to her back.  She endorses nausea and vomiting she denies any bloody or bilious emesis.  Abdominal pain nausea vomiting abdominal pain abdominal pain  Patient states that she drinks 2 mock tails a day plus some shots of liquor.  Seems that her daily alcohol intake could be anywhere between 4 standard drinks and 10.  She states her last drink of alcohol was yesterday evening.  She states that her pain      Abdominal Pain       Home Medications Prior to Admission medications   Not on File      Allergies    Flagyl [metronidazole], Iodine, and Latex    Review of Systems   Review of Systems  Gastrointestinal:  Positive for abdominal pain.   Physical Exam Updated Vital Signs BP (!) 152/85   Pulse 81   Temp 98.3 F (36.8 C) (Oral)   Resp 14   SpO2 99%  Physical Exam Vitals and nursing note reviewed.  Constitutional:      General: She is in acute distress.  HENT:     Head: Normocephalic and atraumatic.     Nose: Nose normal.  Eyes:     General: No scleral icterus. Cardiovascular:     Rate and Rhythm: Normal rate and regular rhythm.     Pulses: Normal pulses.     Heart sounds: Normal heart sounds.  Pulmonary:     Effort: Pulmonary effort is normal. No respiratory distress.     Breath sounds: No wheezing.  Abdominal:     Palpations: Abdomen is soft.      Tenderness: There is abdominal tenderness in the epigastric area, periumbilical area and left upper quadrant.  Musculoskeletal:     Cervical back: Normal range of motion.     Right lower leg: No edema.     Left lower leg: No edema.  Skin:    General: Skin is warm and dry.     Capillary Refill: Capillary refill takes less than 2 seconds.  Neurological:     Mental Status: She is alert. Mental status is at baseline.  Psychiatric:        Mood and Affect: Mood normal.        Behavior: Behavior normal.    ED Results / Procedures / Treatments   Labs (all labs ordered are listed, but only abnormal results are displayed) Labs Reviewed  LIPASE, BLOOD - Abnormal; Notable for the following components:      Result Value   Lipase 533 (*)    All other components within normal limits  COMPREHENSIVE METABOLIC PANEL - Abnormal; Notable for the following components:   Potassium 3.1 (*)    Glucose, Bld 151 (*)    BUN <5 (*)    AST 157 (*)    ALT 86 (*)    Alkaline Phosphatase 136 (*)  Total Bilirubin 1.5 (*)    All other components within normal limits  CBC - Abnormal; Notable for the following components:   WBC 14.0 (*)    Hemoglobin 16.8 (*)    HCT 49.0 (*)    All other components within normal limits  ETHANOL  URINALYSIS, ROUTINE W REFLEX MICROSCOPIC  I-STAT BETA HCG BLOOD, ED ([facility], [facility], [facility] ONLY)    EKG EKG Interpretation  Date/Time:  Saturday Oct 20 2021 18:50:02 EDT Ventricular Rate:  79 PR Interval:  188 QRS Duration: 87 QT Interval:  384 QTC Calculation: 441 R Axis:   103 Text Interpretation: Sinus rhythm Right axis deviation Probable anteroseptal infarct, old Nonspecific T abnormalities, inferior leads No old tracing to compare Confirmed by Meridee Score [id number]) on 10/20/2021 7:23:39 PM  Radiology CT ABDOMEN PELVIS WO CONTRAST  Result Date: 10/20/2021 CLINICAL DATA:  Epigastric pain severe pain likely pancreatitis concern for complication (hx of iodine allergy)  EXAM: CT ABDOMEN AND PELVIS WITHOUT CONTRAST TECHNIQUE: Multidetector CT imaging of the abdomen and pelvis was performed following the standard protocol without IV contrast. RADIATION DOSE REDUCTION: This exam was performed according to the departmental dose-optimization program which includes automated exposure control, adjustment of the mA and/or kV according to patient size and/or use of iterative reconstruction technique. COMPARISON:  None Available. FINDINGS: Lower chest: No acute abnormality Hepatobiliary: Severe diffuse fatty infiltration of the liver. No visible focal hepatic mass. Gallbladder grossly unremarkable. Pancreas: Extensive or edema/inflammation around the pancreas compatible with acute pancreatitis. No visible ductal dilatation. Spleen: No focal abnormality.  Normal size. Adrenals/Urinary Tract: No adrenal abnormality. No focal renal abnormality. No stones or hydronephrosis. Urinary bladder is unremarkable. Stomach/Bowel: Stomach, large and small bowel grossly unremarkable. Vascular/Lymphatic: No evidence of aneurysm or adenopathy. Reproductive: Uterus and adnexa unremarkable.  No mass. Other: No free fluid or free air. Musculoskeletal: No acute bony abnormality. IMPRESSION: Changes of acute pancreatitis with edematous pancreas and surrounding inflammation/edema. Severe diffuse fatty infiltration of the liver. Electronically Signed   By: Charlett Nose M.D.   On: 10/20/2021 19:45    Procedures Procedures  {Document cardiac monitor, telemetry assessment procedure when appropriate:1}  Medications Ordered in ED Medications  LORazepam (ATIVAN) injection 0-4 mg (has no administration in time range)    Or  LORazepam (ATIVAN) tablet 0-4 mg (has no administration in time range)  LORazepam (ATIVAN) injection 0-4 mg (has no administration in time range)    Or  LORazepam (ATIVAN) tablet 0-4 mg (has no administration in time range)  thiamine tablet 100 mg (has no administration in time range)     Or  thiamine (B-1) injection 100 mg (has no administration in time range)  ondansetron (ZOFRAN) injection 4 mg (4 mg Intravenous Given 10/20/21 1817)  HYDROmorphone (DILAUDID) injection 0.5 mg (0.5 mg Intravenous Given 10/20/21 1845)  potassium chloride SA (KLOR-CON M) CR tablet 40 mEq (40 mEq Oral Given 10/20/21 2012)  HYDROmorphone (DILAUDID) injection 0.5 mg (0.5 mg Intravenous Given 10/20/21 2012)  sodium chloride 0.9 % bolus 1,000 mL (1,000 mLs Intravenous New Bag/Given 10/20/21 2019)    ED Course/ Medical Decision Making/ A&P Clinical Course as of 10/20/21 2154  Sat Oct 20, 2021  2017 I reassessed patient.  She is still complaining of discomfort after 1 dose of Dilaudid.  Will redose Dilaudid and reassess.  Also provided with 1 L of normal saline. [WF]  2025 Lipase(!): 533 [WF]  2123 IMPRESSION: Changes of acute pancreatitis with edematous pancreas and surrounding inflammation/edema.   Severe diffuse fatty  infiltration of the liver.     [WF]    Clinical Course User Index [WF] Gailen ShelterFondaw, Tajha Sammarco S, PA                           Medical Decision Making Amount and/or Complexity of Data Reviewed Labs: ordered. Decision-making details documented in ED Course. Radiology: ordered.  Risk OTC drugs. Prescription drug management. Decision regarding hospitalization.   This patient presents to the ED for concern of abd pain, nv, this involves a number of treatment options, and is a complaint that carries with it a high risk of complications and morbidity.  The differential diagnosis includes The causes of generalized abdominal pain include but are not limited to AAA, mesenteric ischemia, appendicitis, diverticulitis, DKA, gastritis, gastroenteritis, AMI, nephrolithiasis, pancreatitis, peritonitis, adrenal insufficiency,lead poisoning, iron toxicity, intestinal ischemia, constipation, UTI,SBO/LBO, splenic rupture, biliary disease, IBD, IBS, PUD, or hepatitis. Ectopic pregnancy, ovarian torsion,  PID.    Co morbidities: Discussed in HPI   Brief History:  Patient is a 41 year old female with past medical history significant for pancreatitis.  She denies any other major medical problems.  She states she does not take any medications.  She states that she woke up this morning with severe epigastric pain.  She states is constant sharp boring and seems to radiate through to her back.  She endorses nausea and vomiting she denies any bloody or bilious emesis.  Patient states that she drinks 2 mock tails a day plus some shots of liquor.  Seems that her daily alcohol intake could be anywhere between 4 standard drinks and 10.  She states her last drink of alcohol was yesterday evening.  She states that her pain    EMR reviewed including pt PMHx, past surgical history and past visits to ER.   See HPI for more details   Lab Tests:   I ordered and independently interpreted labs. Labs notable for    Imaging Studies:  Abnormal findings. I personally reviewed all imaging studies. Imaging notable for IMPRESSION: Changes of acute pancreatitis with edematous pancreas and surrounding inflammation/edema. Severe diffuse fatty infiltration of the liver. Electronically Signed   By: Charlett NoseKevin  Dover M.D.   On: 10/20/2021 19:45     I agree with radiology read abnormal CT scan.   Cardiac Monitoring:  .{Blank single:19197::"The patient was maintained on a cardiac monitor.  I personally viewed and interpreted the cardiac monitored which showed an underlying rhythm of:","NA"} .{Blank single:19197::"EKG non-ischemic","NA"}   Medicines ordered:  I ordered medication including ***  for *** Reevaluation of the patient after these medicines showed that the patient {resolved/improved/worsened:23923::"improved"} I have reviewed the patients home medicines and have made adjustments as needed   Critical Interventions:  .***   Consults/Attending Physician   .{Blank single:19197::"I requested  consultation with ***,  and discussed lab and imaging findings as well as pertinent plan - they recommend: ***","I discussed this case with my attending physician who cosigned this note including patient's presenting symptoms, physical exam, and planned diagnostics and interventions. Attending physician stated agreement with plan or made changes to plan which were implemented."}   Reevaluation:  After the interventions noted above I re-evaluated patient and found that they have :{resolved/improved/worsened:23923::"improved"}   Social Determinants of Health:  Marland Kitchen.{Blank single:19197::"Given cab voucher","Social work/case management involved","The patient's social determinants of health were a factor in the care of this patient"}    Problem List / ED Course:  ***   Dispostion:  After consideration of  the diagnostic results and the patients response to treatment, I feel that the patent would benefit from ***        Final Clinical Impression(s) / ED Diagnoses Final diagnoses:  Alcohol-induced acute pancreatitis, unspecified complication status    Rx / DC Orders ED Discharge Orders     None

## 2021-10-20 NOTE — ED Notes (Signed)
Report given to Kim RN while this RN is on break  

## 2021-10-20 NOTE — ED Triage Notes (Signed)
Pt complains of lower abd pain and n/v. Patient actively vomiting during triage and reports that she thinks she has pancreatitis. Patient alert and oriented upon arrival to the room.

## 2021-10-20 NOTE — ED Provider Notes (Signed)
[facility] EMERGENCY DEPARTMENT Provider Note   CSN: [id number] Arrival date & time: 10/20/21  1741     History {Add pertinent medical, surgical, social history, OB history to HPI:1} Chief Complaint  Patient presents with  . Abdominal Pain    Natalie [name] is a 41 y.o. female.  Patient is a 41 year old female with past medical history significant for pancreatitis.  She denies any other major medical problems.  She states she does not take any medications.  She states that she woke up this morning with severe epigastric pain.  She states is constant sharp boring and seems to radiate through to her back.  She endorses nausea and vomiting she denies any bloody or bilious emesis.  Abdominal pain nausea vomiting abdominal pain abdominal pain  Patient states that she drinks 2 mock tails a day plus some shots of liquor.  Seems that her daily alcohol intake could be anywhere between 4 standard drinks and 10.  She states her last drink of alcohol was yesterday evening.  She states that her pain      Abdominal Pain       Home Medications Prior to Admission medications   Not on File      Allergies    Flagyl [metronidazole], Iodine, and Latex    Review of Systems   Review of Systems  Gastrointestinal:  Positive for abdominal pain.   Physical Exam Updated Vital Signs BP (!) 152/85   Pulse 81   Temp 98.3 F (36.8 C) (Oral)   Resp 14   SpO2 99%  Physical Exam Vitals and nursing note reviewed.  Constitutional:      General: She is in acute distress.  HENT:     Head: Normocephalic and atraumatic.     Nose: Nose normal.  Eyes:     General: No scleral icterus. Cardiovascular:     Rate and Rhythm: Normal rate and regular rhythm.     Pulses: Normal pulses.     Heart sounds: Normal heart sounds.  Pulmonary:     Effort: Pulmonary effort is normal. No respiratory distress.     Breath sounds: No wheezing.  Abdominal:     Palpations: Abdomen is soft.      Tenderness: There is abdominal tenderness in the epigastric area, periumbilical area and left upper quadrant.  Musculoskeletal:     Cervical back: Normal range of motion.     Right lower leg: No edema.     Left lower leg: No edema.  Skin:    General: Skin is warm and dry.     Capillary Refill: Capillary refill takes less than 2 seconds.  Neurological:     Mental Status: She is alert. Mental status is at baseline.  Psychiatric:        Mood and Affect: Mood normal.        Behavior: Behavior normal.    ED Results / Procedures / Treatments   Labs (all labs ordered are listed, but only abnormal results are displayed) Labs Reviewed  LIPASE, BLOOD - Abnormal; Notable for the following components:      Result Value   Lipase 533 (*)    All other components within normal limits  COMPREHENSIVE METABOLIC PANEL - Abnormal; Notable for the following components:   Potassium 3.1 (*)    Glucose, Bld 151 (*)    BUN <5 (*)    AST 157 (*)    ALT 86 (*)    Alkaline Phosphatase 136 (*)  Total Bilirubin 1.5 (*)    All other components within normal limits  CBC - Abnormal; Notable for the following components:   WBC 14.0 (*)    Hemoglobin 16.8 (*)    HCT 49.0 (*)    All other components within normal limits  ETHANOL  URINALYSIS, ROUTINE W REFLEX MICROSCOPIC  I-STAT BETA HCG BLOOD, ED ([facility], [facility], [facility] ONLY)    EKG EKG Interpretation  Date/Time:  Saturday Oct 20 2021 18:50:02 EDT Ventricular Rate:  79 PR Interval:  188 QRS Duration: 87 QT Interval:  384 QTC Calculation: 441 R Axis:   103 Text Interpretation: Sinus rhythm Right axis deviation Probable anteroseptal infarct, old Nonspecific T abnormalities, inferior leads No old tracing to compare Confirmed by Meridee ScoreButler, Michael [phone]) on 10/20/2021 7:23:39 PM  Radiology CT ABDOMEN PELVIS WO CONTRAST  Result Date: 10/20/2021 CLINICAL DATA:  Epigastric pain severe pain likely pancreatitis concern for complication (hx of iodine allergy)  EXAM: CT ABDOMEN AND PELVIS WITHOUT CONTRAST TECHNIQUE: Multidetector CT imaging of the abdomen and pelvis was performed following the standard protocol without IV contrast. RADIATION DOSE REDUCTION: This exam was performed according to the departmental dose-optimization program which includes automated exposure control, adjustment of the mA and/or kV according to patient size and/or use of iterative reconstruction technique. COMPARISON:  None Available. FINDINGS: Lower chest: No acute abnormality Hepatobiliary: Severe diffuse fatty infiltration of the liver. No visible focal hepatic mass. Gallbladder grossly unremarkable. Pancreas: Extensive or edema/inflammation around the pancreas compatible with acute pancreatitis. No visible ductal dilatation. Spleen: No focal abnormality.  Normal size. Adrenals/Urinary Tract: No adrenal abnormality. No focal renal abnormality. No stones or hydronephrosis. Urinary bladder is unremarkable. Stomach/Bowel: Stomach, large and small bowel grossly unremarkable. Vascular/Lymphatic: No evidence of aneurysm or adenopathy. Reproductive: Uterus and adnexa unremarkable.  No mass. Other: No free fluid or free air. Musculoskeletal: No acute bony abnormality. IMPRESSION: Changes of acute pancreatitis with edematous pancreas and surrounding inflammation/edema. Severe diffuse fatty infiltration of the liver. Electronically Signed   By: Charlett NoseKevin  Dover M.D.   On: 10/20/2021 19:45    Procedures Procedures  {Document cardiac monitor, telemetry assessment procedure when appropriate:1}  Medications Ordered in ED Medications  LORazepam (ATIVAN) injection 0-4 mg (has no administration in time range)    Or  LORazepam (ATIVAN) tablet 0-4 mg (has no administration in time range)  LORazepam (ATIVAN) injection 0-4 mg (has no administration in time range)    Or  LORazepam (ATIVAN) tablet 0-4 mg (has no administration in time range)  thiamine tablet 100 mg (has no administration in time range)     Or  thiamine (B-1) injection 100 mg (has no administration in time range)  ondansetron (ZOFRAN) injection 4 mg (4 mg Intravenous Given 10/20/21 1817)  HYDROmorphone (DILAUDID) injection 0.5 mg (0.5 mg Intravenous Given 10/20/21 1845)  potassium chloride SA (KLOR-CON M) CR tablet 40 mEq (40 mEq Oral Given 10/20/21 2012)  HYDROmorphone (DILAUDID) injection 0.5 mg (0.5 mg Intravenous Given 10/20/21 2012)  sodium chloride 0.9 % bolus 1,000 mL (1,000 mLs Intravenous New Bag/Given 10/20/21 2019)    ED Course/ Medical Decision Making/ A&P Clinical Course as of 10/20/21 2027  Sat Oct 20, 2021  2017 I reassessed patient.  She is still complaining of discomfort after 1 dose of Dilaudid.  Will redose Dilaudid and reassess.  Also provided with 1 L of normal saline. [WF]  2025 Lipase(!): 533 [WF]    Clinical Course User Index [WF] Gailen ShelterFondaw, Keagen Heinlen S, [place]  Medical Decision Making Amount and/or Complexity of Data Reviewed Labs: ordered. Radiology: ordered.  Risk Prescription drug management.   This patient presents to the ED for concern of abd pain, nv, this involves a number of treatment options, and is a complaint that carries with it a high risk of complications and morbidity.  The differential diagnosis includes The causes of generalized abdominal pain include but are not limited to AAA, mesenteric ischemia, appendicitis, diverticulitis, DKA, gastritis, gastroenteritis, AMI, nephrolithiasis, pancreatitis, peritonitis, adrenal insufficiency,lead poisoning, iron toxicity, intestinal ischemia, constipation, UTI,SBO/LBO, splenic rupture, biliary disease, IBD, IBS, PUD, or hepatitis. Ectopic pregnancy, ovarian torsion, PID.    Co morbidities: Discussed in HPI   Brief History:  Patient is a 41 year old female with past medical history significant for pancreatitis.  She denies any other major medical problems.  She states she does not take any medications.  She states that she  woke up this morning with severe epigastric pain.  She states is constant sharp boring and seems to radiate through to her back.  She endorses nausea and vomiting she denies any bloody or bilious emesis.  Patient states that she drinks 2 mock tails a day plus some shots of liquor.  Seems that her daily alcohol intake could be anywhere between 4 standard drinks and 10.  She states her last drink of alcohol was yesterday evening.  She states that her pain    EMR reviewed including pt PMHx, past surgical history and past visits to ER.   See HPI for more details   Lab Tests:   I ordered and independently interpreted labs. Labs notable for    Imaging Studies:  Abnormal findings. I personally reviewed all imaging studies. Imaging notable for IMPRESSION: Changes of acute pancreatitis with edematous pancreas and surrounding inflammation/edema. Severe diffuse fatty infiltration of the liver. Electronically Signed   By: Charlett Nose M.D.   On: 10/20/2021 19:45     I agree with radiology read abnormal CT scan.   Cardiac Monitoring:  .{Blank single:19197::"The patient was maintained on a cardiac monitor.  I personally viewed and interpreted the cardiac monitored which showed an underlying rhythm of:","NA"} .{Blank single:19197::"EKG non-ischemic","NA"}   Medicines ordered:  I ordered medication including ***  for *** Reevaluation of the patient after these medicines showed that the patient {resolved/improved/worsened:23923::"improved"} I have reviewed the patients home medicines and have made adjustments as needed   Critical Interventions:  .***   Consults/Attending Physician   .{Blank single:19197::"I requested consultation with ***,  and discussed lab and imaging findings as well as pertinent plan - they recommend: ***","I discussed this case with my attending physician who cosigned this note including patient's presenting symptoms, physical exam, and planned diagnostics and  interventions. Attending physician stated agreement with plan or made changes to plan which were implemented."}   Reevaluation:  After the interventions noted above I re-evaluated patient and found that they have :{resolved/improved/worsened:23923::"improved"}   Social Determinants of Health:  Marland Kitchen{Blank single:19197::"Given cab voucher","Social work/case management involved","The patient's social determinants of health were a factor in the care of this patient"}    Problem List / ED Course:  ***   Dispostion:  After consideration of the diagnostic results and the patients response to treatment, I feel that the patent would benefit from ***        Final Clinical Impression(s) / ED Diagnoses Final diagnoses:  Alcohol-induced acute pancreatitis, unspecified complication status    Rx / DC Orders ED Discharge Orders     None

## 2021-10-20 NOTE — Assessment & Plan Note (Signed)
Chronic. CIWA protocol with IV/PO ativan.

## 2021-10-20 NOTE — ED Notes (Signed)
Patient reminded of the need for a urine sample at this time 

## 2021-10-20 NOTE — H&P (Addendum)
History and Physical    Natalie [name] OAC:[id number] DOB: Oct 05, 1980 DOA: 10/20/2021  DOS: the patient was seen and examined on 10/20/2021  PCP: Pcp, No   Patient coming from: Home  I have personally briefly reviewed patient's old medical records in [place]  CC: abd pain, N/V HPI: 41 year old African-American female history of alcohol abuse, history of alcoholic pancreatitis about 3 years ago, takes no chronic medications presents to the ER today with abdominal pain, nausea vomiting started today.  Patient drinks about 4 alcoholic drinks per day.  Yesterday she states that her brother came into town and she drank half a bottle of tequila with him.  She started having a severe abdominal pain along with nausea vomiting that started early in the morning.  Patient came to the ER for evaluation.  On arrival temp 98.3 heart rate 74 blood pressure 150/95 satting 90% on room air.   Laboratory evaluation:  Lipase 533 Alcohol less than 10 Sodium 140, chest 3.1, BUN of less than 5, creatinine 0.87 AST 157 ALT of 86 alk phos of 136 total bili 1.5  White count 14, hemoglobin 16.8, platelets 267  CT scan which I have reviewed and personally interpreted includes acute pancreatitis with peripancreatic edema.  No gallstones are noted.  Hepatic steatosis/fatty liver.   Patient received multiple doses of IV Dilaudid.  [facility] hospitalist contacted for admission.   ED Course: Lipase 533, CT abdomen shows acute pancreatitis.  Review of Systems:  Review of Systems  Constitutional: Negative.   HENT: Negative.    Eyes: Negative.   Respiratory: Negative.    Cardiovascular: Negative.   Gastrointestinal:  Positive for abdominal pain, nausea and vomiting.  Genitourinary: Negative.   Musculoskeletal: Negative.   Skin: Negative.   Neurological: Negative.   Endo/Heme/Allergies: Negative.   Psychiatric/Behavioral:  Positive for substance abuse.   All other systems reviewed and are  negative.  Past Medical History:  Diagnosis Date   Asthma    Ectopic pregnancy     History reviewed. No pertinent surgical history.   reports that she has been smoking cigarettes. She has never used smokeless tobacco. She reports current alcohol use. She reports current drug use. Drug: Marijuana.  Allergies  Allergen Reactions   Flagyl [Metronidazole]    Iodine    Latex    Strawberry Flavor     History reviewed. No pertinent family history.  Prior to Admission medications   Not on File    Physical Exam: Vitals:   10/20/21 2015 10/20/21 2030 10/20/21 2045 10/20/21 2100  BP: (!) 123/96 (!) 145/99 (!) 149/102 (!) 147/97  Pulse: 76 80 84 85  Resp: _0 Temp:      TempSrc:      SpO2: 99% 100% 99% 100%    Physical Exam Vitals and nursing note reviewed.  Constitutional:      General: She is not in acute distress.    Appearance: Normal appearance. She is not ill-appearing, toxic-appearing or diaphoretic.  HENT:     Head: Normocephalic and atraumatic.     Nose: Nose normal. No rhinorrhea.  Cardiovascular:     Rate and Rhythm: Normal rate and regular rhythm.  Pulmonary:     Effort: Pulmonary effort is normal. No respiratory distress.     Breath sounds: No wheezing.  Abdominal:     General: Bowel sounds are normal.     Tenderness: There is abdominal tenderness in the epigastric area and left upper quadrant.  Musculoskeletal:  Right lower leg: No edema.     Left lower leg: No edema.  Skin:    General: Skin is warm and dry.     Capillary Refill: Capillary refill takes less than 2 seconds.  Neurological:     General: No focal deficit present.     Mental Status: She is alert and oriented to person, place, and time.     Labs on Admission: I have personally reviewed following labs and imaging studies  CBC: Recent Labs  Lab 10/20/21 1756  WBC 14.0*  HGB 16.8*  HCT 49.0*  MCV 97.2  PLT 680   Basic Metabolic Panel: Recent Labs  Lab 10/20/21 1756   NA 140  K 3.1*  CL 103  CO2 23  GLUCOSE 151*  BUN <5*  CREATININE 0.87  CALCIUM 9.7   GFR: CrCl cannot be calculated (Unknown ideal weight.). Liver Function Tests: Recent Labs  Lab 10/20/21 1756  AST 157*  ALT 86*  ALKPHOS 136*  BILITOT 1.5*  PROT 7.5  ALBUMIN 4.5   Recent Labs  Lab 10/20/21 1756  LIPASE 533*   No results for input(s): AMMONIA in the last 168 hours. Coagulation Profile: No results for input(s): INR, PROTIME in the last 168 hours. Cardiac Enzymes: No results for input(s): CKTOTAL, CKMB, CKMBINDEX, TROPONINI, TROPONINIHS in the last 168 hours. BNP (last 3 results) No results for input(s): PROBNP in the last 8760 hours. HbA1C: No results for input(s): HGBA1C in the last 72 hours. CBG: No results for input(s): GLUCAP in the last 168 hours. Lipid Profile: No results for input(s): CHOL, HDL, LDLCALC, TRIG, CHOLHDL, LDLDIRECT in the last 72 hours. Thyroid Function Tests: No results for input(s): TSH, T4TOTAL, FREET4, T3FREE, THYROIDAB in the last 72 hours. Anemia Panel: No results for input(s): VITAMINB12, FOLATE, FERRITIN, TIBC, IRON, RETICCTPCT in the last 72 hours. Urine analysis:    Component Value Date/Time   LABSPEC 1.020 12/23/2008 1833   PHURINE 6.5 12/23/2008 1833   GLUCOSEU NEGATIVE 12/23/2008 1833   HGBUR NEGATIVE 12/23/2008 1833   BILIRUBINUR SMALL (A) 12/23/2008 1833   KETONESUR TRACE (A) 12/23/2008 1833   PROTEINUR 30 (A) 12/23/2008 1833   UROBILINOGEN 4.0 (H) 12/23/2008 1833   NITRITE NEGATIVE 12/23/2008 1833   LEUKOCYTESUR  12/23/2008 1833    NEGATIVE Biochemical Testing Only. Please order routine urinalysis from main lab if confirmatory testing is needed.    Radiological Exams on Admission: I have personally reviewed images CT ABDOMEN PELVIS WO CONTRAST  Result Date: 10/20/2021 CLINICAL DATA:  Epigastric pain severe pain likely pancreatitis concern for complication (hx of iodine allergy) EXAM: CT ABDOMEN AND PELVIS WITHOUT  CONTRAST TECHNIQUE: Multidetector CT imaging of the abdomen and pelvis was performed following the standard protocol without IV contrast. RADIATION DOSE REDUCTION: This exam was performed according to the departmental dose-optimization program which includes automated exposure control, adjustment of the mA and/or kV according to patient size and/or use of iterative reconstruction technique. COMPARISON:  None Available. FINDINGS: Lower chest: No acute abnormality Hepatobiliary: Severe diffuse fatty infiltration of the liver. No visible focal hepatic mass. Gallbladder grossly unremarkable. Pancreas: Extensive or edema/inflammation around the pancreas compatible with acute pancreatitis. No visible ductal dilatation. Spleen: No focal abnormality.  Normal size. Adrenals/Urinary Tract: No adrenal abnormality. No focal renal abnormality. No stones or hydronephrosis. Urinary bladder is unremarkable. Stomach/Bowel: Stomach, large and small bowel grossly unremarkable. Vascular/Lymphatic: No evidence of aneurysm or adenopathy. Reproductive: Uterus and adnexa unremarkable.  No mass. Other: No free fluid or free air. Musculoskeletal:  No acute bony abnormality. IMPRESSION: Changes of acute pancreatitis with edematous pancreas and surrounding inflammation/edema. Severe diffuse fatty infiltration of the liver. Electronically Signed   By: Rolm Baptise M.D.   On: 10/20/2021 19:45    EKG: My personal interpretation of EKG shows: NSR   Assessment/Plan Principal Problem:   Alcoholic pancreatitis Active Problems:   Alcohol abuse   Alcoholic hepatitis   Fatty liver    Assessment and Plan: * Alcoholic pancreatitis Admit to med/surg inpatient bed. IV LR @ 100 ml/hr. IV dilaudid 1 mg q2h prn pain. Repeat lipase in AM. Due to alcohol abuse. Alcoholic pancreatitis is a Acute illness/condition that poses a threat to life or bodily function.   Alcohol abuse Chronic. CIWA protocol with IV/PO ativan.  Fatty liver New.   Likely due to alcohol abuse.  Alcoholic hepatitis Acute. Due to alcohol abuse. Repeat LFTs in AM.   DVT prophylaxis: SQ Heparin Code Status: Full Code Family Communication: discussed with pt and significant other marquita  Disposition Plan: return home  Consults called: none  Admission status: Inpatient, Med-Surg   Kristopher Oppenheim, DO [facility] 10/20/2021, 10:18 PM

## 2021-10-20 NOTE — Assessment & Plan Note (Signed)
Acute. Due to alcohol abuse. Repeat LFTs in AM.

## 2021-10-20 NOTE — Assessment & Plan Note (Addendum)
Admit to med/surg inpatient bed. IV LR @ 100 ml/hr. IV dilaudid 1 mg q2h prn pain. Repeat lipase in AM. Due to alcohol abuse. Alcoholic pancreatitis is a Acute illness/condition that poses a threat to life or bodily function.

## 2021-10-20 NOTE — Assessment & Plan Note (Signed)
New.  Likely due to alcohol abuse.

## 2021-10-20 NOTE — ED Notes (Signed)
Provider notified of the patients need for pain meds at this time via secure chat

## 2021-10-21 DIAGNOSIS — K852 Alcohol induced acute pancreatitis without necrosis or infection: Secondary | ICD-10-CM

## 2021-10-21 LAB — URINALYSIS, ROUTINE W REFLEX MICROSCOPIC
Bilirubin Urine: NEGATIVE
Glucose, UA: NEGATIVE mg/dL
Ketones, ur: 5 mg/dL — AB
Leukocytes,Ua: NEGATIVE
Nitrite: NEGATIVE
Protein, ur: 30 mg/dL — AB
Specific Gravity, Urine: 1.023 (ref 1.005–1.030)
pH: 5 (ref 5.0–8.0)

## 2021-10-21 LAB — COMPREHENSIVE METABOLIC PANEL
ALT: 59 U/L — ABNORMAL HIGH (ref 0–44)
AST: 81 U/L — ABNORMAL HIGH (ref 15–41)
Albumin: 3.4 g/dL — ABNORMAL LOW (ref 3.5–5.0)
Alkaline Phosphatase: 107 U/L (ref 38–126)
Anion gap: 7 (ref 5–15)
BUN: 5 mg/dL — ABNORMAL LOW (ref 6–20)
CO2: 25 mmol/L (ref 22–32)
Calcium: 8.6 mg/dL — ABNORMAL LOW (ref 8.9–10.3)
Chloride: 108 mmol/L (ref 98–111)
Creatinine, Ser: 0.75 mg/dL (ref 0.44–1.00)
GFR, Estimated: >60 mL/min (ref >60)
Glucose, Bld: 102 mg/dL — ABNORMAL HIGH (ref 70–99)
Potassium: 3.5 mmol/L (ref 3.5–5.1)
Sodium: 140 mmol/L (ref 135–145)
Total Bilirubin: 1.2 mg/dL (ref 0.3–1.2)
Total Protein: 5.9 g/dL — ABNORMAL LOW (ref 6.5–8.1)

## 2021-10-21 LAB — CBC WITH DIFFERENTIAL/PLATELET
Abs Immature Granulocytes: 0.06 10*3/uL (ref 0.00–0.07)
Basophils Absolute: 0 10*3/uL (ref 0.0–0.1)
Basophils Relative: 0 %
Eosinophils Absolute: 0.3 10*3/uL (ref 0.0–0.5)
Eosinophils Relative: 2 %
HCT: 41.3 % (ref 36.0–46.0)
Hemoglobin: 13.9 g/dL (ref 12.0–15.0)
Immature Granulocytes: 0 %
Lymphocytes Relative: 15 %
Lymphs Abs: 2.2 10*3/uL (ref 0.7–4.0)
MCH: 33 pg (ref 26.0–34.0)
MCHC: 33.7 g/dL (ref 30.0–36.0)
MCV: 98.1 fL (ref 80.0–100.0)
Monocytes Absolute: 0.6 10*3/uL (ref 0.1–1.0)
Monocytes Relative: 4 %
Neutro Abs: 11.7 10*3/uL — ABNORMAL HIGH (ref 1.7–7.7)
Neutrophils Relative %: 79 %
Platelets: 220 10*3/uL (ref 150–400)
RBC: 4.21 MIL/uL (ref 3.87–5.11)
RDW: 13.9 % (ref 11.5–15.5)
WBC: 14.8 10*3/uL — ABNORMAL HIGH (ref 4.0–10.5)
nRBC: 0 % (ref 0.0–0.2)

## 2021-10-21 LAB — HIV ANTIBODY (ROUTINE TESTING W REFLEX): HIV Screen 4th Generation wRfx: NONREACTIVE

## 2021-10-21 LAB — LIPASE, BLOOD: Lipase: 440 U/L — ABNORMAL HIGH (ref 11–51)

## 2021-10-21 MED ORDER — HYDROMORPHONE HCL 1 MG/ML IJ SOLN
1.0000 mg | INTRAMUSCULAR | Status: AC | PRN
Start: 2021-10-21 — End: 2021-10-21
  Administered 2021-10-21 (×4): 1 mg via INTRAVENOUS
  Filled 2021-10-21 (×4): qty 1

## 2021-10-21 MED ORDER — OXYCODONE HCL 5 MG PO TABS
5.0000 mg | ORAL_TABLET | ORAL | Status: DC | PRN
  Administered 2021-10-21 – 2021-10-24 (×3): 5 mg via ORAL
  Filled 2021-10-21 (×5): qty 1

## 2021-10-21 MED ORDER — POTASSIUM CHLORIDE 10 MEQ/100ML IV SOLN
10.0000 meq | INTRAVENOUS | Status: AC
  Administered 2021-10-21 (×3): 10 meq via INTRAVENOUS
  Filled 2021-10-21 (×3): qty 100

## 2021-10-21 MED ORDER — LABETALOL HCL 5 MG/ML IV SOLN
10.0000 mg | INTRAVENOUS | Status: DC | PRN
  Administered 2021-10-21: 10 mg via INTRAVENOUS
  Filled 2021-10-21: qty 4

## 2021-10-21 MED ORDER — HEPARIN SODIUM (PORCINE) 5000 UNIT/ML IJ SOLN
5000.0000 [IU] | Freq: Three times a day (TID) | INTRAMUSCULAR | Status: DC
  Administered 2021-10-21 – 2021-10-24 (×10): 5000 [IU] via SUBCUTANEOUS
  Filled 2021-10-21 (×10): qty 1

## 2021-10-21 MED ORDER — ACETAMINOPHEN 650 MG RE SUPP: 650.0000 mg | RECTAL | Status: DC | PRN

## 2021-10-21 MED ORDER — HYDROMORPHONE HCL 1 MG/ML IJ SOLN
1.0000 mg | INTRAMUSCULAR | Status: DC | PRN
  Administered 2021-10-21 – 2021-10-24 (×10): 1 mg via INTRAVENOUS
  Filled 2021-10-21 (×10): qty 1

## 2021-10-21 MED ORDER — LIP MEDEX EX OINT
TOPICAL_OINTMENT | CUTANEOUS | Status: DC | PRN
  Filled 2021-10-21: qty 7

## 2021-10-21 MED ORDER — HYDRALAZINE HCL 25 MG PO TABS: 25.0000 mg | ORAL_TABLET | ORAL | Status: DC | PRN

## 2021-10-21 MED ORDER — ONDANSETRON HCL 4 MG/2ML IJ SOLN
4.0000 mg | Freq: Four times a day (QID) | INTRAMUSCULAR | Status: DC | PRN
  Administered 2021-10-22 – 2021-10-23 (×3): 4 mg via INTRAVENOUS
  Filled 2021-10-21 (×3): qty 2

## 2021-10-21 NOTE — Progress Notes (Signed)
Progress Note    Natalie [name]   VOJ:[id number]  DOB: 02/19/1981  DOA: 10/20/2021     1 PCP: Pcp, No  Initial CC: Abdominal pain  Hospital Course: Natalie [name] is a 41 year old female with PMH alcoholic pancreatitis who presented with abdominal pain and associated nausea/vomiting.  Natalie [name] states that Natalie [name] began drinking large amounts of alcohol recently and developed worsening pain prompting her to come to the ER. Initial lipase was 533.  CT abdomen/pelvis was performed and showed edematous pancreas with surrounding inflammation/edema consistent with acute pancreatitis.  Natalie [name] was started on bowel rest and fluids.  Interval History:  Resting in bed appearing comfortable with significant other bedside.  Natalie [name] was interested in trying clear liquids today. Has not had any further vomiting since admission.  Assessment and Plan: * Alcoholic pancreatitis - Patient endorsed increased amount of alcohol use prior to admission.  Etiology considered alcohol induced -Initial lipase 533 with CT findings consistent with acute pancreatitis - Continue fluids - Patient is amenable to trial of diet today, start on clear liquids and monitor for tolerance -Continue trending lipase  Alcohol abuse Chronic. CIWA protocol with IV/PO ativan.  Fatty liver New.  Likely due to alcohol abuse.  Alcoholic hepatitis Acute. Due to alcohol abuse. Repeat LFTs in AM. -Check PT/INR  Old records reviewed in assessment of this patient  Antimicrobials:   DVT prophylaxis:  heparin injection 5,000 Units Start: 10/21/21 0600 SCDs Start: 10/21/21 0158   Code Status:   Code Status: Full Code  Disposition Plan: Home in 1 to 2 days Status is: Inpatient  Objective: Blood pressure (!) 157/88, pulse 64, temperature 98.2 F (36.8 C), temperature source Oral, resp. rate 18, weight 70.3 kg, SpO2 97 %.  Examination:  Physical Exam Constitutional:      Appearance: Normal appearance.  HENT:     Head: Normocephalic and  atraumatic.     Mouth/Throat:     Mouth: Mucous membranes are moist.  Eyes:     Extraocular Movements: Extraocular movements intact.  Cardiovascular:     Rate and Rhythm: Normal rate and regular rhythm.  Pulmonary:     Effort: Pulmonary effort is normal.     Breath sounds: Normal breath sounds.  Abdominal:     General: Bowel sounds are normal. There is no distension.     Palpations: Abdomen is soft.     Comments: Tender to palpation mostly in the left quadrants, no rebound or guarding  Musculoskeletal:        General: Normal range of motion.     Cervical back: Normal range of motion and neck supple.  Skin:    General: Skin is warm and dry.  Neurological:     General: No focal deficit present.     Mental Status: Natalie [name] is alert.  Psychiatric:        Mood and Affect: Mood normal.     Consultants:    Procedures:    Data Reviewed: Results for orders placed or performed during the hospital encounter of 10/20/21 (from the past 24 hour(s))  Lipase, blood     Status: Abnormal   Collection Time: 10/20/21  5:56 PM  Result Value Ref Range   Lipase 533 (H) 11 - 51 U/L  Comprehensive metabolic panel     Status: Abnormal   Collection Time: 10/20/21  5:56 PM  Result Value Ref Range   Sodium 140 135 - 145 mmol/L   Potassium 3.1 (L) 3.5 - 5.1 mmol/L   Chloride 103 98 -  111 mmol/L   CO2 23 22 - 32 mmol/L   Glucose, Bld 151 (H) 70 - 99 mg/dL   BUN <5 (L) 6 - 20 mg/dL   Creatinine, Ser 4.090.87 0.44 - 1.00 mg/dL   Calcium 9.7 8.9 - 81.110.3 mg/dL   Total Protein 7.5 6.5 - 8.1 g/dL   Albumin 4.5 3.5 - 5.0 g/dL   AST 914157 (H) 15 - 41 U/L   ALT 86 (H) 0 - 44 U/L   Alkaline Phosphatase 136 (H) 38 - 126 U/L   Total Bilirubin 1.5 (H) 0.3 - 1.2 mg/dL   GFR, Estimated >78>60 >29>60 mL/min   Anion gap 14 5 - 15  CBC     Status: Abnormal   Collection Time: 10/20/21  5:56 PM  Result Value Ref Range   WBC 14.0 (H) 4.0 - 10.5 K/uL   RBC 5.04 3.87 - 5.11 MIL/uL   Hemoglobin 16.8 (H) 12.0 - 15.0 g/dL   HCT  56.249.0 (H) 13.036.0 - 46.0 %   MCV 97.2 80.0 - 100.0 fL   MCH 33.3 26.0 - 34.0 pg   MCHC 34.3 30.0 - 36.0 g/dL   RDW 86.514.0 78.411.5 - 69.615.5 %   Platelets 267 150 - 400 K/uL   nRBC 0.0 0.0 - 0.2 %  Magnesium     Status: None   Collection Time: 10/20/21  5:56 PM  Result Value Ref Range   Magnesium 1.7 1.7 - 2.4 mg/dL  I-Stat beta hCG blood, ED     Status: None   Collection Time: 10/20/21  6:20 PM  Result Value Ref Range   I-stat hCG, quantitative <5.0 <5 mIU/mL   Comment 3          Ethanol     Status: None   Collection Time: 10/20/21  6:25 PM  Result Value Ref Range   Alcohol, Ethyl (B) <10 <10 mg/dL  Urinalysis, Routine w reflex microscopic Urine, Clean Catch     Status: Abnormal   Collection Time: 10/21/21 12:44 AM  Result Value Ref Range   Color, Urine AMBER (A) YELLOW   APPearance CLOUDY (A) CLEAR   Specific Gravity, Urine 1.023 1.005 - 1.030   pH 5.0 5.0 - 8.0   Glucose, UA NEGATIVE NEGATIVE mg/dL   Hgb urine dipstick MODERATE (A) NEGATIVE   Bilirubin Urine NEGATIVE NEGATIVE   Ketones, ur 5 (A) NEGATIVE mg/dL   Protein, ur 30 (A) NEGATIVE mg/dL   Nitrite NEGATIVE NEGATIVE   Leukocytes,Ua NEGATIVE NEGATIVE   RBC / HPF 0-5 0 - 5 RBC/hpf   WBC, UA 0-5 0 - 5 WBC/hpf   Bacteria, UA FEW (A) NONE SEEN   Squamous Epithelial / LPF 6-10 0 - 5   Mucus PRESENT    Hyaline Casts, UA PRESENT    Amorphous Crystal PRESENT   Comprehensive metabolic panel     Status: Abnormal   Collection Time: 10/21/21  4:03 AM  Result Value Ref Range   Sodium 140 135 - 145 mmol/L   Potassium 3.5 3.5 - 5.1 mmol/L   Chloride 108 98 - 111 mmol/L   CO2 25 22 - 32 mmol/L   Glucose, Bld 102 (H) 70 - 99 mg/dL   BUN 5 (L) 6 - 20 mg/dL   Creatinine, Ser 2.950.75 0.44 - 1.00 mg/dL   Calcium 8.6 (L) 8.9 - 10.3 mg/dL   Total Protein 5.9 (L) 6.5 - 8.1 g/dL   Albumin 3.4 (L) 3.5 - 5.0 g/dL   AST 81 (H) 15 -  41 U/L   ALT 59 (H) 0 - 44 U/L   Alkaline Phosphatase 107 38 - 126 U/L   Total Bilirubin 1.2 0.3 - 1.2 mg/dL    GFR, Estimated >57 >01 mL/min   Anion gap 7 5 - 15  CBC with Differential/Platelet     Status: Abnormal   Collection Time: 10/21/21  4:03 AM  Result Value Ref Range   WBC 14.8 (H) 4.0 - 10.5 K/uL   RBC 4.21 3.87 - 5.11 MIL/uL   Hemoglobin 13.9 12.0 - 15.0 g/dL   HCT 77.9 39.0 - 30.0 %   MCV 98.1 80.0 - 100.0 fL   MCH 33.0 26.0 - 34.0 pg   MCHC 33.7 30.0 - 36.0 g/dL   RDW 92.3 30.0 - 76.2 %   Platelets 220 150 - 400 K/uL   nRBC 0.0 0.0 - 0.2 %   Neutrophils Relative % 79 %   Neutro Abs 11.7 (H) 1.7 - 7.7 K/uL   Lymphocytes Relative 15 %   Lymphs Abs 2.2 0.7 - 4.0 K/uL   Monocytes Relative 4 %   Monocytes Absolute 0.6 0.1 - 1.0 K/uL   Eosinophils Relative 2 %   Eosinophils Absolute 0.3 0.0 - 0.5 K/uL   Basophils Relative 0 %   Basophils Absolute 0.0 0.0 - 0.1 K/uL   Immature Granulocytes 0 %   Abs Immature Granulocytes 0.06 0.00 - 0.07 K/uL  Lipase, blood     Status: Abnormal   Collection Time: 10/21/21  4:03 AM  Result Value Ref Range   Lipase 440 (H) 11 - 51 U/L  HIV Antibody (routine testing w rflx)     Status: None   Collection Time: 10/21/21  4:03 AM  Result Value Ref Range   HIV Screen 4th Generation wRfx Non Reactive Non Reactive    I have Reviewed nursing notes, Vitals, and Lab results since pt's last encounter. Pertinent lab results : see above I have ordered test including BMP, CBC, Mg I have reviewed the last note from staff over past 24 hours I have discussed pt's care plan and test results with nursing staff, case manager   LOS: 1 day   Lewie Chamber, MD [facility] 10/21/2021, 11:09 AM

## 2021-10-21 NOTE — Progress Notes (Signed)
Pt admitted to room 5M20 with family at bedside. Oriented to room and call light process. Pain medication given, CIWA completed. Pt talking with family at this time. Will continue to monitor. Informed of belonging policy.

## 2021-10-21 NOTE — ED Notes (Signed)
Provider notified of the patient's need for further pain management

## 2021-10-21 NOTE — Hospital Course (Signed)
Ms. Natalie [name] is a 41 year old female with PMH alcoholic pancreatitis who presented with abdominal pain and associated nausea/vomiting.  She states that she began drinking large amounts of alcohol recently and developed worsening pain prompting her to come to the ER. Initial lipase was 533.  CT abdomen/pelvis was performed and showed edematous pancreas with surrounding inflammation/edema consistent with acute pancreatitis.  She was started on bowel rest and fluids.

## 2021-10-22 LAB — COMPREHENSIVE METABOLIC PANEL
ALT: 55 U/L — ABNORMAL HIGH (ref 0–44)
AST: 86 U/L — ABNORMAL HIGH (ref 15–41)
Albumin: 3.2 g/dL — ABNORMAL LOW (ref 3.5–5.0)
Alkaline Phosphatase: 86 U/L (ref 38–126)
Anion gap: 4 — ABNORMAL LOW (ref 5–15)
BUN: <5 mg/dL — ABNORMAL LOW (ref 6–20)
CO2: 25 mmol/L (ref 22–32)
Calcium: 8.5 mg/dL — ABNORMAL LOW (ref 8.9–10.3)
Chloride: 105 mmol/L (ref 98–111)
Creatinine, Ser: 0.66 mg/dL (ref 0.44–1.00)
GFR, Estimated: >60 mL/min (ref >60)
Glucose, Bld: 90 mg/dL (ref 70–99)
Potassium: 3.5 mmol/L (ref 3.5–5.1)
Sodium: 134 mmol/L — ABNORMAL LOW (ref 135–145)
Total Bilirubin: 1.2 mg/dL (ref 0.3–1.2)
Total Protein: 5.6 g/dL — ABNORMAL LOW (ref 6.5–8.1)

## 2021-10-22 LAB — CBC WITH DIFFERENTIAL/PLATELET
Abs Immature Granulocytes: 0.03 10*3/uL (ref 0.00–0.07)
Basophils Absolute: 0 10*3/uL (ref 0.0–0.1)
Basophils Relative: 0 %
Eosinophils Absolute: 0.3 10*3/uL (ref 0.0–0.5)
Eosinophils Relative: 3 %
HCT: 36.7 % (ref 36.0–46.0)
Hemoglobin: 12.2 g/dL (ref 12.0–15.0)
Immature Granulocytes: 0 %
Lymphocytes Relative: 18 %
Lymphs Abs: 2 10*3/uL (ref 0.7–4.0)
MCH: 32.9 pg (ref 26.0–34.0)
MCHC: 33.2 g/dL (ref 30.0–36.0)
MCV: 98.9 fL (ref 80.0–100.0)
Monocytes Absolute: 0.4 10*3/uL (ref 0.1–1.0)
Monocytes Relative: 3 %
Neutro Abs: 8.2 10*3/uL — ABNORMAL HIGH (ref 1.7–7.7)
Neutrophils Relative %: 76 %
Platelets: 170 10*3/uL (ref 150–400)
RBC: 3.71 MIL/uL — ABNORMAL LOW (ref 3.87–5.11)
RDW: 13.6 % (ref 11.5–15.5)
WBC: 10.9 10*3/uL — ABNORMAL HIGH (ref 4.0–10.5)
nRBC: 0 % (ref 0.0–0.2)

## 2021-10-22 LAB — LIPASE, BLOOD: Lipase: 385 U/L — ABNORMAL HIGH (ref 11–51)

## 2021-10-22 LAB — MAGNESIUM: Magnesium: 1.5 mg/dL — ABNORMAL LOW (ref 1.7–2.4)

## 2021-10-22 MED ORDER — SENNOSIDES-DOCUSATE SODIUM 8.6-50 MG PO TABS
1.0000 | ORAL_TABLET | Freq: Two times a day (BID) | ORAL | Status: DC
Start: 2021-10-22 — End: 2021-10-24
  Administered 2021-10-22 – 2021-10-24 (×5): 1 via ORAL
  Filled 2021-10-22 (×5): qty 1

## 2021-10-22 MED ORDER — MAGNESIUM SULFATE 2 GM/50ML IV SOLN
2.0000 g | Freq: Once | INTRAVENOUS | Status: AC
  Administered 2021-10-22: 2 g via INTRAVENOUS
  Filled 2021-10-22: qty 50

## 2021-10-22 MED ORDER — POTASSIUM CHLORIDE CRYS ER 20 MEQ PO TBCR
40.0000 meq | EXTENDED_RELEASE_TABLET | Freq: Once | ORAL | Status: AC
  Administered 2021-10-22: 40 meq via ORAL
  Filled 2021-10-22: qty 2

## 2021-10-22 MED ORDER — POLYETHYLENE GLYCOL 3350 17 G PO PACK
17.0000 g | PACK | Freq: Every day | ORAL | Status: DC
  Administered 2021-10-22 – 2021-10-23 (×2): 17 g via ORAL
  Filled 2021-10-22 (×3): qty 1

## 2021-10-22 NOTE — TOC Initial Note (Signed)
Transition of Care [facility]) - Initial/Assessment Note    Patient Details  Name: Natalie [name] MRN: [id number] Date of Birth: 01-21-1981  Transition of [facility]) CM/SW Contact:    Ralene Bathe, LCSWA Phone Number: 10/22/2021, 12:10 PM  Clinical Narrative:                 Transition of Care Department [facility]) has reviewed patient.  Patient from home with a friend and admitted for alcoholic pancreatitis.   Patient reports having 4 children, siblings, and mother as natural supports.  Patient is employed at [employer] and is independent, drives self, and has no DME at the home.  Patient does not have a PCP.  RNCM will schedule PCP appointment for patient tomorrow as offices are closed today for the holiday.    We will continue to monitor patient advancement through interdisciplinary progression rounds. If new patient transition needs arise, please place a TOC consult.     Expected Discharge Plan: Home/Self Care Barriers to Discharge: Continued Medical Work up   Patient Goals and CMS Choice        Expected Discharge Plan and Services Expected Discharge Plan: Home/Self Care       Living arrangements for the past 2 months: Single Family Home                                      Prior Living Arrangements/Services Living arrangements for the past 2 months: Single Family Home Lives with:: Friends Patient language and need for interpreter reviewed:: Yes Do you feel safe going back to the place where you live?: Yes        Care giver support system in place?: Yes (comment)   Criminal Activity/Legal Involvement Pertinent to Current Situation/Hospitalization: No - Comment as needed  Activities of Daily Living Home Assistive Devices/Equipment: None ADL Screening (condition at time of admission) Patient's cognitive ability adequate to safely complete daily activities?: Yes Is the patient deaf or have difficulty hearing?: No Does the patient have difficulty seeing, even  when wearing glasses/contacts?: No Does the patient have difficulty concentrating, remembering, or making decisions?: No Patient able to express need for assistance with ADLs?: Yes Does the patient have difficulty dressing or bathing?: No Independently performs ADLs?: Yes (appropriate for developmental age) Does the patient have difficulty walking or climbing stairs?: No Weakness of Legs: None Weakness of Arms/Hands: None  Permission Sought/Granted Permission sought to share information with : Case Manager                Emotional Assessment Appearance:: Appears stated age Attitude/Demeanor/Rapport: Engaged Affect (typically observed): Appropriate Orientation: : Oriented to Situation, Oriented to  Time, Oriented to Place, Oriented to Self Alcohol / Substance Use: Alcohol Use Psych Involvement: No (comment)  Admission diagnosis:  Alcoholic pancreatitis [K85.20] Alcohol-induced acute pancreatitis, unspecified complication status [K85.20] Patient Active Problem List   Diagnosis Date Noted   Alcoholic pancreatitis 10/20/2021   Alcohol abuse 10/20/2021   Alcoholic hepatitis 10/20/2021   Fatty liver 10/20/2021   PCP:  Pcp, No Pharmacy:   [facility] [address] [place] [address] Phone: [phone] Fax: [phone]     Social Determinants of Health (SDOH) Interventions    Readmission Risk Interventions     View : No data to display.

## 2021-10-22 NOTE — Progress Notes (Signed)
Progress Note    CHRISTL ALCARAZ   [id number]  DOB: 11-21-1980  DOA: 10/20/2021     2 PCP: Pcp, No  Initial CC: Abdominal pain  Hospital Course: Ms. Natalie [name] is a 41 year old female with PMH alcoholic pancreatitis who presented with abdominal pain and associated nausea/vomiting.  She states that she began drinking large amounts of alcohol recently and developed worsening pain prompting her to come to the ER. Initial lipase was 533.  CT abdomen/pelvis was performed and showed edematous pancreas with surrounding inflammation/edema consistent with acute pancreatitis.  She was started on bowel rest and fluids.  Interval History:  No events overnight.  Tolerated clear liquids yesterday.  She was amenable to full liquids today but not yet ready for solids.  Still having some pain but controlled with medication.  Endorses flatus but no bowel movement yet.  Regimen added on today.  Assessment and Plan: * Alcoholic pancreatitis - Patient endorsed increased amount of alcohol use prior to admission.  Etiology considered alcohol induced -Initial lipase 533 with CT findings consistent with acute pancreatitis - Continue fluids -Some improvement in pain and tolerating clear liquids.  Advance today to full liquids.  She was not ready for solid food yet.  Alcohol abuse - No signs of withdrawal, d/c CIWA  Fatty liver New.  Likely due to alcohol abuse.  Alcoholic hepatitis Acute. Due to alcohol abuse. LFTs downtrending   Old records reviewed in assessment of this patient  Antimicrobials:   DVT prophylaxis:  heparin injection 5,000 Units Start: 10/21/21 0600 SCDs Start: 10/21/21 0158   Code Status:   Code Status: Full Code  Disposition Plan: Home in 1 to 2 days Status is: Inpatient  Objective: Blood pressure (!) 141/91, pulse 75, temperature 98.4 F (36.9 C), temperature source Oral, resp. rate 18, weight 70.3 kg, SpO2 99 %.  Examination:  Physical Exam Constitutional:       Appearance: Normal appearance.  HENT:     Head: Normocephalic and atraumatic.     Mouth/Throat:     Mouth: Mucous membranes are moist.  Eyes:     Extraocular Movements: Extraocular movements intact.  Cardiovascular:     Rate and Rhythm: Normal rate and regular rhythm.  Pulmonary:     Effort: Pulmonary effort is normal.     Breath sounds: Normal breath sounds.  Abdominal:     General: Bowel sounds are normal. There is no distension.     Palpations: Abdomen is soft.     Comments: Improved tenderness involving left sided quadrants.  Still no rebound or guarding  Musculoskeletal:        General: Normal range of motion.     Cervical back: Normal range of motion and neck supple.  Skin:    General: Skin is warm and dry.  Neurological:     General: No focal deficit present.     Mental Status: She is alert.  Psychiatric:        Mood and Affect: Mood normal.     Consultants:    Procedures:    Data Reviewed: Results for orders placed or performed during the hospital encounter of 10/20/21 (from the past 24 hour(s))  Lipase, blood     Status: Abnormal   Collection Time: 10/22/21  1:37 AM  Result Value Ref Range   Lipase 385 (H) 11 - 51 U/L  CBC with Differential/Platelet     Status: Abnormal   Collection Time: 10/22/21  1:37 AM  Result Value Ref Range   WBC 10.9 (  H) 4.0 - 10.5 K/uL   RBC 3.71 (L) 3.87 - 5.11 MIL/uL   Hemoglobin 12.2 12.0 - 15.0 g/dL   HCT 36.7 36.0 - 46.0 %   MCV 98.9 80.0 - 100.0 fL   MCH 32.9 26.0 - 34.0 pg   MCHC 33.2 30.0 - 36.0 g/dL   RDW 13.6 11.5 - 15.5 %   Platelets 170 150 - 400 K/uL   nRBC 0.0 0.0 - 0.2 %   Neutrophils Relative % 76 %   Neutro Abs 8.2 (H) 1.7 - 7.7 K/uL   Lymphocytes Relative 18 %   Lymphs Abs 2.0 0.7 - 4.0 K/uL   Monocytes Relative 3 %   Monocytes Absolute 0.4 0.1 - 1.0 K/uL   Eosinophils Relative 3 %   Eosinophils Absolute 0.3 0.0 - 0.5 K/uL   Basophils Relative 0 %   Basophils Absolute 0.0 0.0 - 0.1 K/uL   Immature  Granulocytes 0 %   Abs Immature Granulocytes 0.03 0.00 - 0.07 K/uL  Comprehensive metabolic panel     Status: Abnormal   Collection Time: 10/22/21  1:37 AM  Result Value Ref Range   Sodium 134 (L) 135 - 145 mmol/L   Potassium 3.5 3.5 - 5.1 mmol/L   Chloride 105 98 - 111 mmol/L   CO2 25 22 - 32 mmol/L   Glucose, Bld 90 70 - 99 mg/dL   BUN <5 (L) 6 - 20 mg/dL   Creatinine, Ser 0.66 0.44 - 1.00 mg/dL   Calcium 8.5 (L) 8.9 - 10.3 mg/dL   Total Protein 5.6 (L) 6.5 - 8.1 g/dL   Albumin 3.2 (L) 3.5 - 5.0 g/dL   AST 86 (H) 15 - 41 U/L   ALT 55 (H) 0 - 44 U/L   Alkaline Phosphatase 86 38 - 126 U/L   Total Bilirubin 1.2 0.3 - 1.2 mg/dL   GFR, Estimated >60 >60 mL/min   Anion gap 4 (L) 5 - 15  Magnesium     Status: Abnormal   Collection Time: 10/22/21  1:37 AM  Result Value Ref Range   Magnesium 1.5 (L) 1.7 - 2.4 mg/dL    I have Reviewed nursing notes, Vitals, and Lab results since pt's last encounter. Pertinent lab results : see above I have ordered test including BMP, CBC, Mg I have reviewed the last note from staff over past 24 hours I have discussed pt's care plan and test results with nursing staff, case manager   LOS: 2 days   Dwyane Dee, MD [facility] 10/22/2021, 12:39 PM

## 2021-10-23 LAB — CBC WITH DIFFERENTIAL/PLATELET
Abs Immature Granulocytes: 0.03 10*3/uL (ref 0.00–0.07)
Basophils Absolute: 0 10*3/uL (ref 0.0–0.1)
Basophils Relative: 0 %
Eosinophils Absolute: 0.6 10*3/uL — ABNORMAL HIGH (ref 0.0–0.5)
Eosinophils Relative: 7 %
HCT: 37 % (ref 36.0–46.0)
Hemoglobin: 12.5 g/dL (ref 12.0–15.0)
Immature Granulocytes: 0 %
Lymphocytes Relative: 28 %
Lymphs Abs: 2.4 10*3/uL (ref 0.7–4.0)
MCH: 32.8 pg (ref 26.0–34.0)
MCHC: 33.8 g/dL (ref 30.0–36.0)
MCV: 97.1 fL (ref 80.0–100.0)
Monocytes Absolute: 0.6 10*3/uL (ref 0.1–1.0)
Monocytes Relative: 6 %
Neutro Abs: 5 10*3/uL (ref 1.7–7.7)
Neutrophils Relative %: 59 %
Platelets: 178 10*3/uL (ref 150–400)
RBC: 3.81 MIL/uL — ABNORMAL LOW (ref 3.87–5.11)
RDW: 13.6 % (ref 11.5–15.5)
WBC: 8.5 10*3/uL (ref 4.0–10.5)
nRBC: 0 % (ref 0.0–0.2)

## 2021-10-23 LAB — COMPREHENSIVE METABOLIC PANEL
ALT: 55 U/L — ABNORMAL HIGH (ref 0–44)
AST: 82 U/L — ABNORMAL HIGH (ref 15–41)
Albumin: 3.3 g/dL — ABNORMAL LOW (ref 3.5–5.0)
Alkaline Phosphatase: 89 U/L (ref 38–126)
Anion gap: 7 (ref 5–15)
BUN: <5 mg/dL — ABNORMAL LOW (ref 6–20)
CO2: 25 mmol/L (ref 22–32)
Calcium: 8.7 mg/dL — ABNORMAL LOW (ref 8.9–10.3)
Chloride: 105 mmol/L (ref 98–111)
Creatinine, Ser: 0.6 mg/dL (ref 0.44–1.00)
GFR, Estimated: >60 mL/min (ref >60)
Glucose, Bld: 82 mg/dL (ref 70–99)
Potassium: 3.9 mmol/L (ref 3.5–5.1)
Sodium: 137 mmol/L (ref 135–145)
Total Bilirubin: 1.2 mg/dL (ref 0.3–1.2)
Total Protein: 5.8 g/dL — ABNORMAL LOW (ref 6.5–8.1)

## 2021-10-23 LAB — MAGNESIUM: Magnesium: 2 mg/dL (ref 1.7–2.4)

## 2021-10-23 LAB — LIPASE, BLOOD: Lipase: 89 U/L — ABNORMAL HIGH (ref 11–51)

## 2021-10-23 NOTE — Plan of Care (Signed)
  Problem: Education: Goal: Knowledge of General Education information will improve Description: Including pain rating scale, medication(s)/side effects and non-pharmacologic comfort measures Outcome: Completed/Met

## 2021-10-23 NOTE — Plan of Care (Signed)
  Problem: Education: Goal: Knowledge of General Education information will improve Description: Including pain rating scale, medication(s)/side effects and non-pharmacologic comfort measures Outcome: Progressing   Problem: Clinical Measurements: Goal: Ability to maintain clinical measurements within normal limits will improve Outcome: Progressing   Problem: Activity: Goal: Risk for activity intolerance will decrease Outcome: Progressing   Problem: Elimination: Goal: Will not experience complications related to bowel motility Outcome: Progressing   Problem: Pain Managment: Goal: General experience of comfort will improve Outcome: Progressing   Problem: Safety: Goal: Ability to remain free from injury will improve Outcome: Progressing   Problem: Skin Integrity: Goal: Risk for impaired skin integrity will decrease Outcome: Progressing   

## 2021-10-23 NOTE — Progress Notes (Signed)
Progress Note    Natalie [name]   [id number]  DOB: 1980-09-11  DOA: 10/20/2021     3 PCP: Pcp, No  Initial CC: Abdominal pain  Hospital Course: Natalie [name] is a 41 year old female with PMH alcoholic pancreatitis who presented with abdominal pain and associated nausea/vomiting.  She states that she began drinking large amounts of alcohol recently and developed worsening pain prompting her to come to the ER. Initial lipase was 533.  CT abdomen/pelvis was performed and showed edematous pancreas with surrounding inflammation/edema consistent with acute pancreatitis.  She was started on bowel rest and fluids.  Interval History:  No events overnight. Pain continues to improve.  Okay to advance to soft diet today.  Assessment and Plan: * Alcoholic pancreatitis - Patient endorsed increased amount of alcohol use prior to admission.  Etiology considered alcohol induced -Initial lipase 533 with CT findings consistent with acute pancreatitis - lipase downtrended with improved pain as well - s/p IVF - advance to soft diet this afternoon; if tolerates should be stable for d/c on Wed  Alcohol abuse - No signs of withdrawal, d/c CIWA  Fatty liver New.  Likely due to alcohol abuse.  Alcoholic hepatitis Acute. Due to alcohol abuse. LFTs downtrending   Old records reviewed in assessment of this patient  Antimicrobials:   DVT prophylaxis:  heparin injection 5,000 Units Start: 10/21/21 0600 SCDs Start: 10/21/21 0158   Code Status:   Code Status: Full Code  Disposition Plan: Home Wed if tolerates soft diet  Status is: Inpatient  Objective: Blood pressure 139/87, pulse (!) 58, temperature 98.5 F (36.9 C), temperature source Oral, resp. rate 18, height 5' 9.5" (1.765 m), weight 71.2 kg, SpO2 96 %.  Examination:  Physical Exam Constitutional:      Appearance: Normal appearance.  HENT:     Head: Normocephalic and atraumatic.     Mouth/Throat:     Mouth: Mucous membranes are  moist.  Eyes:     Extraocular Movements: Extraocular movements intact.  Cardiovascular:     Rate and Rhythm: Normal rate and regular rhythm.  Pulmonary:     Effort: Pulmonary effort is normal.     Breath sounds: Normal breath sounds.  Abdominal:     General: Bowel sounds are normal. There is no distension.     Palpations: Abdomen is soft.     Comments: Improved tenderness involving left sided quadrants.  Still no rebound or guarding  Musculoskeletal:        General: Normal range of motion.     Cervical back: Normal range of motion and neck supple.  Skin:    General: Skin is warm and dry.  Neurological:     General: No focal deficit present.     Mental Status: She is alert.  Psychiatric:        Mood and Affect: Mood normal.     Consultants:    Procedures:    Data Reviewed: Results for orders placed or performed during the hospital encounter of 10/20/21 (from the past 24 hour(s))  Lipase, blood     Status: Abnormal   Collection Time: 10/23/21  3:38 AM  Result Value Ref Range   Lipase 89 (H) 11 - 51 U/L  CBC with Differential/Platelet     Status: Abnormal   Collection Time: 10/23/21  3:38 AM  Result Value Ref Range   WBC 8.5 4.0 - 10.5 K/uL   RBC 3.81 (L) 3.87 - 5.11 MIL/uL   Hemoglobin 12.5 12.0 - 15.0 g/dL  HCT 37.0 36.0 - 46.0 %   MCV 97.1 80.0 - 100.0 fL   MCH 32.8 26.0 - 34.0 pg   MCHC 33.8 30.0 - 36.0 g/dL   RDW 30.0 76.2 - 26.3 %   Platelets 178 150 - 400 K/uL   nRBC 0.0 0.0 - 0.2 %   Neutrophils Relative % 59 %   Neutro Abs 5.0 1.7 - 7.7 K/uL   Lymphocytes Relative 28 %   Lymphs Abs 2.4 0.7 - 4.0 K/uL   Monocytes Relative 6 %   Monocytes Absolute 0.6 0.1 - 1.0 K/uL   Eosinophils Relative 7 %   Eosinophils Absolute 0.6 (H) 0.0 - 0.5 K/uL   Basophils Relative 0 %   Basophils Absolute 0.0 0.0 - 0.1 K/uL   Immature Granulocytes 0 %   Abs Immature Granulocytes 0.03 0.00 - 0.07 K/uL  Comprehensive metabolic panel     Status: Abnormal   Collection Time:  10/23/21  3:38 AM  Result Value Ref Range   Sodium 137 135 - 145 mmol/L   Potassium 3.9 3.5 - 5.1 mmol/L   Chloride 105 98 - 111 mmol/L   CO2 25 22 - 32 mmol/L   Glucose, Bld 82 70 - 99 mg/dL   BUN <5 (L) 6 - 20 mg/dL   Creatinine, Ser 3.35 0.44 - 1.00 mg/dL   Calcium 8.7 (L) 8.9 - 10.3 mg/dL   Total Protein 5.8 (L) 6.5 - 8.1 g/dL   Albumin 3.3 (L) 3.5 - 5.0 g/dL   AST 82 (H) 15 - 41 U/L   ALT 55 (H) 0 - 44 U/L   Alkaline Phosphatase 89 38 - 126 U/L   Total Bilirubin 1.2 0.3 - 1.2 mg/dL   GFR, Estimated >45 >62 mL/min   Anion gap 7 5 - 15  Magnesium     Status: None   Collection Time: 10/23/21  3:38 AM  Result Value Ref Range   Magnesium 2.0 1.7 - 2.4 mg/dL    I have Reviewed nursing notes, Vitals, and Lab results since pt's last encounter. Pertinent lab results : see above I have ordered test including BMP, CBC, Mg I have reviewed the last note from staff over past 24 hours I have discussed pt's care plan and test results with nursing staff, case manager   LOS: 3 days   Lewie Chamber, MD [facility] 10/23/2021, 3:04 PM

## 2021-10-23 NOTE — TOC Progression Note (Signed)
Transition of Care [facility]) - Progression Note    Patient Details  Name: Natalie [name] MRN: [id number] Date of Birth: 21-Jul-1980  Transition of Care [facility]) CM/SW Contact  Tom-Johnson, Renea Ee, RN Phone Number: 10/23/2021, 10:48 AM  Clinical Narrative:     CM called [facility] to schedule hospital f/u appointment and was told that appointments are out till August. Message will be sent to provider to okay patient seen earlier. They will call patient with scheduled appointment. CM will continue to follow with needs.   Expected Discharge Plan: Home/Self Care Barriers to Discharge: Continued Medical Work up  Expected Discharge Plan and Services Expected Discharge Plan: Home/Self Care       Living arrangements for the past 2 months: Single Family Home                                       Social Determinants of Health (SDOH) Interventions    Readmission Risk Interventions     View : No data to display.

## 2021-10-24 ENCOUNTER — Other Ambulatory Visit (HOSPITAL_COMMUNITY): Payer: Self-pay

## 2021-10-24 LAB — COMPREHENSIVE METABOLIC PANEL
ALT: 69 U/L — ABNORMAL HIGH (ref 0–44)
AST: 99 U/L — ABNORMAL HIGH (ref 15–41)
Albumin: 3.9 g/dL (ref 3.5–5.0)
Alkaline Phosphatase: 112 U/L (ref 38–126)
Anion gap: 9 (ref 5–15)
BUN: <5 mg/dL — ABNORMAL LOW (ref 6–20)
CO2: 22 mmol/L (ref 22–32)
Calcium: 9.5 mg/dL (ref 8.9–10.3)
Chloride: 105 mmol/L (ref 98–111)
Creatinine, Ser: 0.69 mg/dL (ref 0.44–1.00)
GFR, Estimated: >60 mL/min (ref >60)
Glucose, Bld: 99 mg/dL (ref 70–99)
Potassium: 4 mmol/L (ref 3.5–5.1)
Sodium: 136 mmol/L (ref 135–145)
Total Bilirubin: 1.3 mg/dL — ABNORMAL HIGH (ref 0.3–1.2)
Total Protein: 7 g/dL (ref 6.5–8.1)

## 2021-10-24 LAB — CBC WITH DIFFERENTIAL/PLATELET
Abs Immature Granulocytes: 0.03 10*3/uL (ref 0.00–0.07)
Basophils Absolute: 0.1 10*3/uL (ref 0.0–0.1)
Basophils Relative: 1 %
Eosinophils Absolute: 0.8 10*3/uL — ABNORMAL HIGH (ref 0.0–0.5)
Eosinophils Relative: 8 %
HCT: 44 % (ref 36.0–46.0)
Hemoglobin: 14.6 g/dL (ref 12.0–15.0)
Immature Granulocytes: 0 %
Lymphocytes Relative: 23 %
Lymphs Abs: 2.3 10*3/uL (ref 0.7–4.0)
MCH: 32.5 pg (ref 26.0–34.0)
MCHC: 33.2 g/dL (ref 30.0–36.0)
MCV: 98 fL (ref 80.0–100.0)
Monocytes Absolute: 0.8 10*3/uL (ref 0.1–1.0)
Monocytes Relative: 8 %
Neutro Abs: 6.1 10*3/uL (ref 1.7–7.7)
Neutrophils Relative %: 60 %
Platelets: 213 10*3/uL (ref 150–400)
RBC: 4.49 MIL/uL (ref 3.87–5.11)
RDW: 13.5 % (ref 11.5–15.5)
WBC: 10.1 10*3/uL (ref 4.0–10.5)
nRBC: 0 % (ref 0.0–0.2)

## 2021-10-24 LAB — MAGNESIUM: Magnesium: 1.9 mg/dL (ref 1.7–2.4)

## 2021-10-24 MED ORDER — POLYETHYLENE GLYCOL 3350 17 GM/SCOOP PO POWD
17.0000 g | Freq: Every day | ORAL | 0 refills | Status: AC
  Filled 2021-10-24: qty 238, 14d supply, fill #0

## 2021-10-24 MED ORDER — LOPERAMIDE HCL 2 MG PO CAPS: 2.0000 mg | ORAL_CAPSULE | ORAL | Status: DC | PRN

## 2021-10-24 MED ORDER — ACETAMINOPHEN 500 MG PO TABS
500.0000 mg | ORAL_TABLET | Freq: Three times a day (TID) | ORAL | 0 refills | Status: DC | PRN
  Filled 2021-10-24: qty 30, 10d supply, fill #0

## 2021-10-24 MED ORDER — THIAMINE HCL 100 MG PO TABS
100.0000 mg | ORAL_TABLET | Freq: Every day | ORAL | 0 refills | Status: DC
  Filled 2021-10-24: qty 30, 30d supply, fill #0

## 2021-10-24 MED ORDER — OXYCODONE-ACETAMINOPHEN 5-325 MG PO TABS
1.0000 | ORAL_TABLET | Freq: Three times a day (TID) | ORAL | 0 refills | Status: DC | PRN
  Filled 2021-10-24: qty 15, 5d supply, fill #0

## 2021-10-24 NOTE — Plan of Care (Signed)

## 2021-10-24 NOTE — TOC Transition Note (Signed)
Transition of Care [facility]) - CM/SW Discharge Note   Patient Details  Name: Natalie [name] MRN: [id number] Date of Birth: 1980/09/17  [facility] CM/SW Contact:  Tom-Johnson, Hershal Coria, RN Phone Number: 10/24/2021, 2:45 PM   Clinical Narrative:     Patient is scheduled for discharge today. No PT/OT needs or recommendations noted. No DME needs. Hosp f/u scheduled with [facility] and someone will call patient with scheduled date. Family to transport at discharge. No further TOC needs noted.     Final next level of care: Home/Self Care Barriers to Discharge: Barriers Resolved   Patient Goals and CMS Choice Patient states their goals for this hospitalization and ongoing recovery are:: To return home CMS Medicare.gov Compare Post Acute Care list provided to:: Patient Choice offered to / list presented to : NA  Discharge Placement                Patient to be transferred to facility by: Family      Discharge Plan and Services                DME Arranged: N/A DME Agency: NA       HH Arranged: NA HH Agency: NA        Social Determinants of Health (SDOH) Interventions     Readmission Risk Interventions     View : No data to display.

## 2021-10-24 NOTE — Progress Notes (Signed)
DISCHARGE NOTE HOME Natalie [name] to be discharged Home per MD order. Discussed prescriptions and follow up appointments with the patient. Prescriptions given to patient; medication list explained in detail. Patient verbalized understanding.  Skin clean, dry and intact without evidence of skin break down, no evidence of skin tears noted. IV catheter discontinued intact. Site without signs and symptoms of complications. Dressing and pressure applied. Pt denies pain at the site currently. No complaints noted.  Patient free of lines, drains, and wounds.   An After Visit Summary (AVS) was printed and given to the patient. Patient escorted via wheelchair, and discharged home via private auto.  Myrtis Hopping, RN

## 2021-10-26 NOTE — Discharge Summary (Signed)
Physician Discharge Summary   Patient: Natalie [name] MRN: [id number] DOB: 22-Oct-1980  Admit date:     10/20/2021  Discharge date: 10/24/2021  Discharge Physician: Lynden OxfordPranav Hershy Flenner  PCP: Pcp, No  Recommendations at discharge: Follow-up with PCP in 1 week  Discharge Diagnoses: Principal Problem:   Alcoholic pancreatitis Active Problems:   Alcohol abuse   Alcoholic hepatitis   Fatty liver  Hospital Course: Natalie [name] is a 41 year old female with PMH alcoholic pancreatitis who presented with abdominal pain and associated nausea/vomiting.  She states that she began drinking large amounts of alcohol recently and developed worsening pain prompting her to come to the ER. Initial lipase was 533.  CT abdomen/pelvis was performed and showed edematous pancreas with surrounding inflammation/edema consistent with acute pancreatitis.  She was started on bowel rest and fluids.  Assessment and Plan: * Alcoholic pancreatitis Patient endorsed increased amount of alcohol use prior to admission.   Etiology considered alcohol induced Initial lipase 533 with CT findings consistent with acute pancreatitis lipase downtrended with improved pain as well Treated with IVF Reported some nausea and diarrhea but symptoms resolved as the day progressed.   Alcohol abuse No signs of withdrawal, d/c CIWA  Fatty liver New.  Likely due to alcohol abuse.  Alcoholic hepatitis Acute. Due to alcohol abuse. LFTs downtrending   [facility] [place] Sub[place] database was reviewed. and patient was instructed, not to drive, operate heavy machinery, perform activities at heights, swimming or participation in water activities or provide baby-sitting services while on Pain, Sleep and Anxiety Medications; until their outpatient Physician has advised to do so again. Also recommended to not to take more than prescribed Pain, Sleep and Anxiety Medications.   Consultants: none Procedures  performed:  none DISCHARGE MEDICATION: Allergies as of 10/24/2021       Reactions   Flagyl [metronidazole]    Iodine    Latex    Strawberry Flavor         Medication List     TAKE these medications    Acetaminophen Extra Strength 500 MG tablet Generic drug: acetaminophen Take 1 tablet (500 mg total) by mouth every 8 (eight) hours as needed for headache, mild pain or fever. What changed:  how much to take when to take this reasons to take this   ALLERGY EYE OP Place 1 drop into both eyes daily as needed (red/itchy eyes).   ALLERGY MED PO Take 1 tablet by mouth daily as needed (allergies).   oxyCODONE-acetaminophen 5-325 MG tablet Commonly known as: Percocet Take 1 tablet by mouth every 8 (eight) hours as needed for severe pain or moderate pain.   polyethylene glycol powder 17 GM/SCOOP powder Commonly known as: GLYCOLAX/MIRALAX Take 1 capful with water (17 g) by mouth daily.   thiamine 100 MG tablet Take 1 tablet (100 mg total) by mouth daily.        Follow-up Information     [facility]. Call.   Contact information: [address] [place] [phone]               Disposition: Home Diet recommendation: low fat diet  Discharge Exam: Vitals:   10/23/21 1700 10/23/21 2045 10/24/21 0513 10/24/21 0942  BP: 128/75 (!) 156/99 (!) 147/102 (!) 125/91  Pulse: 66 72 67 73  Resp:  19 16 17   Temp: 98.6 F (37 C) 98.9 F (37.2 C) 98.8 F (37.1 C) 98.3 F (36.8 C)  TempSrc: Oral Oral Oral Oral  SpO2: 98% 98% 98% 97%  Weight:   65.8 kg   Height:       General: Appear in no distress; no visible Abnormal Neck Mass Or lumps, Conjunctiva normal Cardiovascular: S1 and S2 Present, no Murmur, Respiratory: good respiratory effort, Bilateral Air entry present and CTA, no Crackles, no wheezes Abdomen: Bowel Sound present, mild tenderness in epigastric region Extremities: no Pedal  edema Neurology: alert and oriented to time, place, and person  Gait not checked due to patient safety concerns Filed Weights   10/21/21 0442 10/23/21 0513 10/24/21 0513  Weight: 70.3 kg 71.2 kg 65.8 kg   Condition at discharge: stable  The results of significant diagnostics from this hospitalization (including imaging, microbiology, ancillary and laboratory) are listed below for reference.   Imaging Studies: CT ABDOMEN PELVIS WO CONTRAST  Result Date: 10/20/2021 CLINICAL DATA:  Epigastric pain severe pain likely pancreatitis concern for complication (hx of iodine allergy) EXAM: CT ABDOMEN AND PELVIS WITHOUT CONTRAST TECHNIQUE: Multidetector CT imaging of the abdomen and pelvis was performed following the standard protocol without IV contrast. RADIATION DOSE REDUCTION: This exam was performed according to the departmental dose-optimization program which includes automated exposure control, adjustment of the mA and/or kV according to patient size and/or use of iterative reconstruction technique. COMPARISON:  None Available. FINDINGS: Lower chest: No acute abnormality Hepatobiliary: Severe diffuse fatty infiltration of the liver. No visible focal hepatic mass. Gallbladder grossly unremarkable. Pancreas: Extensive or edema/inflammation around the pancreas compatible with acute pancreatitis. No visible ductal dilatation. Spleen: No focal abnormality.  Normal size. Adrenals/Urinary Tract: No adrenal abnormality. No focal renal abnormality. No stones or hydronephrosis. Urinary bladder is unremarkable. Stomach/Bowel: Stomach, large and small bowel grossly unremarkable. Vascular/Lymphatic: No evidence of aneurysm or adenopathy. Reproductive: Uterus and adnexa unremarkable.  No mass. Other: No free fluid or free air. Musculoskeletal: No acute bony abnormality. IMPRESSION: Changes of acute pancreatitis with edematous pancreas and surrounding inflammation/edema. Severe diffuse fatty infiltration of the liver.  Electronically Signed   By: Charlett Nose M.D.   On: 10/20/2021 19:45    Microbiology: Results for orders placed or performed during the hospital encounter of 12/23/08  GC/chlamydia probe amp, genital     Status: None   Collection Time: 12/23/08  7:05 PM  Result Value Ref Range Status   GC Probe Amp, Genital  NEGATIVE Final    NEGATIVE (NOTE)  Testing performed using the BD Probetec ET Chlamydia trachomatis and Neisseria gonorrhea amplified DNA assay.   Chlamydia, DNA Probe  NEGATIVE Final    NEGATIVE (NOTE)  Testing performed using the BD Probetec ET Chlamydia trachomatis and Neisseria gonorrhea amplified DNA assay.  Wet prep, genital     Status: Abnormal   Collection Time: 12/23/08  7:05 PM  Result Value Ref Range Status   Yeast Wet Prep HPF POC NONE SEEN NONE SEEN Final   Trich, Wet Prep NONE SEEN NONE SEEN Final   Clue Cells Wet Prep HPF POC MANY (A) NONE SEEN Final   WBC, Wet Prep HPF POC MODERATE (A) NONE SEEN Final   Labs: CBC: Recent Labs  Lab 10/20/21 1756 10/21/21 0403 10/22/21 0137 10/23/21 0338 10/24/21 0347  WBC 14.0* 14.8* 10.9* 8.5 10.1  NEUTROABS  --  11.7* 8.2* 5.0 6.1  HGB 16.8* 13.9 12.2 12.5 14.6  HCT 49.0* 41.3 36.7 37.0 44.0  MCV 97.2 98.1 98.9 97.1 98.0  PLT 267 220 170 178 213   Basic Metabolic Panel: Recent Labs  Lab  10/20/21 1756 10/21/21 0403 10/22/21 0137 10/23/21 0338 10/24/21 0347  NA 140 140 134* 137 136  K 3.1* 3.5 3.5 3.9 4.0  CL 103 108 105 105 105  CO2 23 25 25 25 22   GLUCOSE 151* 102* 90 82 99  BUN <5* 5* <5* <5* <5*  CREATININE 0.87 0.75 0.66 0.60 0.69  CALCIUM 9.7 8.6* 8.5* 8.7* 9.5  MG 1.7  --  1.5* 2.0 1.9   Liver Function Tests: Recent Labs  Lab 10/20/21 1756 10/21/21 0403 10/22/21 0137 10/23/21 0338 10/24/21 0347  AST 157* 81* 86* 82* 99*  ALT 86* 59* 55* 55* 69*  ALKPHOS 136* 107 86 89 112  BILITOT 1.5* 1.2 1.2 1.2 1.3*  PROT 7.5 5.9* 5.6* 5.8* 7.0  ALBUMIN 4.5 3.4* 3.2* 3.3* 3.9   CBG: No results for  input(s): GLUCAP in the last 168 hours.  Discharge time spent: greater than 30 minutes.  Signed: 10/26/21, MD [facility] 10/24/2021

## 2021-10-31 ENCOUNTER — Inpatient Hospital Stay: Payer: Self-pay | Admitting: Nurse Practitioner

## 2021-11-02 ENCOUNTER — Ambulatory Visit (INDEPENDENT_AMBULATORY_CARE_PROVIDER_SITE_OTHER): Payer: Self-pay | Admitting: Nurse Practitioner

## 2021-11-02 ENCOUNTER — Encounter: Payer: Self-pay | Admitting: Nurse Practitioner

## 2021-11-02 VITALS — BP 117/88 | HR 80 | Temp 98.0°F | Ht 69.0 in | Wt 150.2 lb

## 2021-11-02 DIAGNOSIS — Z09 Encounter for follow-up examination after completed treatment for conditions other than malignant neoplasm: Secondary | ICD-10-CM

## 2021-11-02 DIAGNOSIS — Z0000 Encounter for general adult medical examination without abnormal findings: Secondary | ICD-10-CM

## 2021-11-02 DIAGNOSIS — K852 Alcohol induced acute pancreatitis without necrosis or infection: Secondary | ICD-10-CM

## 2021-11-02 NOTE — Patient Instructions (Signed)
You were seen today in the [facility] for wellness visit and hospital follow up. Labs were collected, results will be available via MyChart or, if abnormal, you will be contacted by clinic staff. You were prescribed medications, please take as directed. Please follow up in 3 mths for reevaluation of abdominal pain

## 2021-11-02 NOTE — Progress Notes (Unsigned)
[facility] Patient [facility] [address] Natalie [name] [place], [place]  [address] Phone:  [phone]   Fax:  [phone] Subjective:   Patient ID: Natalie [name], female    DOB: 1980/06/06, 41 y.o.   MRN: [id number]  Chief Complaint  Patient presents with   Hospitalization Follow-up    Pt stated she still has pain on her left side it comes and goes.   HPI Natalie [name] 41 y.o. female  has a past medical history of Asthma and Ectopic pregnancy. To the [facility] for hospital follow up and to establish PCP. Last visit with primary care doctor several years ago.  Was admitted to the hospital on 5/28 after presenting to the ED with left upper and lower abdominal pain. Since discharge, continues to have some mild pain. Currently rates pain 5/10 and describes as aching. Denies any nausea, vomiting or discharge. Denies any alcohol consumption since discharge. Prior to abstaining from alcohol, was drinking the equivalent of 6 alcoholic beverages per day.   Has not been monitoring meals and/ exercising.   Denies any other complaints today. Denies any fatigue, chest pain, shortness of breath, HA or dizziness. Denies any blurred vision, numbness or tingling.  Past Medical History:  Diagnosis Date   Asthma    Ectopic pregnancy     History reviewed. No pertinent surgical history.  Family History  Problem Relation Age of Onset   Heart murmur Mother    Arthritis Mother    Arthritis Father    Heart murmur Father    Depression Maternal Aunt    Cancer Paternal Grandfather     Social History   Socioeconomic History   Marital status: Divorced    Spouse name: Not on file   Number of children: Not on file   Years of education: Not on file   Highest education level: Not on file  Occupational History   Not on file  Tobacco Use   Smoking status: Some Days    Types: Cigarettes   Smokeless tobacco: Never  Vaping Use   Vaping Use: Never used  Substance and Sexual Activity   Alcohol use: Yes    Drug use: Yes    Types: Marijuana   Sexual activity: Not on file  Other Topics Concern   Not on file  Social History Narrative   Not on file   Social Determinants of Health   Financial Resource Strain: Not on file  Food Insecurity: Not on file  Transportation Needs: Not on file  Physical Activity: Not on file  Stress: Not on file  Social Connections: Not on file  Intimate Partner Violence: Not on file    Outpatient Medications Prior to Visit  Medication Sig Dispense Refill   acetaminophen (TYLENOL) 500 MG tablet Take 1 tablet (500 mg total) by mouth every 8 (eight) hours as needed for headache, mild pain or fever. 30 tablet 0   diphenhydrAMINE HCl (ALLERGY MED PO) Take 1 tablet by mouth daily as needed (allergies).     Naphazoline-Pheniramine (ALLERGY EYE OP) Place 1 drop into both eyes daily as needed (red/itchy eyes).     polyethylene glycol powder (GLYCOLAX/MIRALAX) 17 GM/SCOOP powder Take 1 capful with water (17 g) by mouth daily. 238 g 0   thiamine 100 MG tablet Take 1 tablet (100 mg total) by mouth daily. 30 tablet 0   oxyCODONE-acetaminophen (PERCOCET) 5-325 MG tablet Take 1 tablet by mouth every 8 (eight) hours as needed for severe pain or moderate pain. (Patient not  taking: Reported on 11/02/2021) 15 tablet 0   No facility-administered medications prior to visit.    Allergies  Allergen Reactions   Fish Allergy Anaphylaxis and Other (See Comments)    Pt stated fish causes her to go into cardiac arrest   Flagyl [Metronidazole]    Iodine    Latex    Oxycodone Itching    Pt stated it caused her to be confused    Shellfish Allergy Hives and Itching   Strawberry Flavor     ROS     Objective:    Physical Exam  BP 117/88 (BP Location: Right Arm, Patient Position: Sitting, Cuff Size: Normal)   Pulse 80   Temp 98 F (36.7 C)   Ht 5\' 9"  (1.753 m)   Wt 150 lb 4 oz (68.2 kg)   SpO2 100%   BMI 22.19 kg/m  Wt Readings from Last 3 Encounters:  11/02/21 150 lb 4  oz (68.2 kg)  10/24/21 145 lb (65.8 kg)  09/12/21 158 lb (71.7 kg)     There is no immunization history on file for this patient.  Diabetic Foot Exam - Simple   No data filed     No results found for: "TSH" Lab Results  Component Value Date   WBC 10.1 10/24/2021   HGB 14.6 10/24/2021   HCT 44.0 10/24/2021   MCV 98.0 10/24/2021   PLT 213 10/24/2021   Lab Results  Component Value Date   NA 136 10/24/2021   K 4.0 10/24/2021   CO2 22 10/24/2021   GLUCOSE 99 10/24/2021   BUN <5 (L) 10/24/2021   CREATININE 0.69 10/24/2021   BILITOT 1.3 (H) 10/24/2021   ALKPHOS 112 10/24/2021   AST 99 (H) 10/24/2021   ALT 69 (H) 10/24/2021   PROT 7.0 10/24/2021   ALBUMIN 3.9 10/24/2021   CALCIUM 9.5 10/24/2021   ANIONGAP 9 10/24/2021   No results found for: "CHOL" No results found for: "HDL" No results found for: "LDLCALC" No results found for: "TRIG" No results found for: "CHOLHDL" No results found for: "HGBA1C"     Assessment & Plan:   Problem List Items Addressed This Visit   None   I am having 10/26/2021 maintain her diphenhydrAMINE HCl (ALLERGY MED PO), Naphazoline-Pheniramine (ALLERGY EYE OP), acetaminophen, polyethylene glycol powder, thiamine, and oxyCODONE-acetaminophen.  No orders of the defined types were placed in this encounter.    Natalie Barbara, NP

## 2021-11-03 LAB — CBC WITH DIFFERENTIAL/PLATELET
Basophils Absolute: 0.1 10*3/uL (ref 0.0–0.2)
Basos: 1 %
EOS (ABSOLUTE): 0.2 10*3/uL (ref 0.0–0.4)
Eos: 3 %
Hematocrit: 41.5 % (ref 34.0–46.6)
Hemoglobin: 14.2 g/dL (ref 11.1–15.9)
Immature Grans (Abs): 0 10*3/uL (ref 0.0–0.1)
Immature Granulocytes: 0 %
Lymphocytes Absolute: 2.8 10*3/uL (ref 0.7–3.1)
Lymphs: 31 %
MCH: 32.6 pg (ref 26.6–33.0)
MCHC: 34.2 g/dL (ref 31.5–35.7)
MCV: 95 fL (ref 79–97)
Monocytes Absolute: 0.4 10*3/uL (ref 0.1–0.9)
Monocytes: 5 %
Neutrophils Absolute: 5.5 10*3/uL (ref 1.4–7.0)
Neutrophils: 60 %
Platelets: 256 10*3/uL (ref 150–450)
RBC: 4.36 x10E6/uL (ref 3.77–5.28)
RDW: 12 % (ref 11.7–15.4)
WBC: 9.1 10*3/uL (ref 3.4–10.8)

## 2021-11-03 LAB — COMPREHENSIVE METABOLIC PANEL
ALT: 42 IU/L — ABNORMAL HIGH (ref 0–32)
AST: 42 IU/L — ABNORMAL HIGH (ref 0–40)
Albumin/Globulin Ratio: 1.6 (ref 1.2–2.2)
Albumin: 4.1 g/dL (ref 3.8–4.8)
Alkaline Phosphatase: 80 IU/L (ref 44–121)
BUN/Creatinine Ratio: 15 (ref 9–23)
BUN: 9 mg/dL (ref 6–24)
Bilirubin Total: 0.4 mg/dL (ref 0.0–1.2)
CO2: 23 mmol/L (ref 20–29)
Calcium: 9.8 mg/dL (ref 8.7–10.2)
Chloride: 100 mmol/L (ref 96–106)
Creatinine, Ser: 0.61 mg/dL (ref 0.57–1.00)
Globulin, Total: 2.5 g/dL (ref 1.5–4.5)
Glucose: 81 mg/dL (ref 70–99)
Potassium: 3.8 mmol/L (ref 3.5–5.2)
Sodium: 138 mmol/L (ref 134–144)
Total Protein: 6.6 g/dL (ref 6.0–8.5)
eGFR: 115 mL/min/{1.73_m2} (ref >59)

## 2021-11-03 LAB — LIPID PANEL
Chol/HDL Ratio: 3.3 ratio (ref 0.0–4.4)
Cholesterol, Total: 166 mg/dL (ref 100–199)
HDL: 50 mg/dL (ref >39)
LDL Chol Calc (NIH): 98 mg/dL (ref 0–99)
Triglycerides: 97 mg/dL (ref 0–149)
VLDL Cholesterol Cal: 18 mg/dL (ref 5–40)

## 2021-11-03 LAB — HEMOGLOBIN A1C
Est. average glucose Bld gHb Est-mCnc: 105 mg/dL
Hgb A1c MFr Bld: 5.3 % (ref 4.8–5.6)

## 2021-11-03 LAB — LIPASE: Lipase: 59 U/L (ref 14–72)

## 2021-12-24 ENCOUNTER — Other Ambulatory Visit: Payer: Self-pay

## 2021-12-24 ENCOUNTER — Observation Stay (HOSPITAL_COMMUNITY)
Admission: EM | Admit: 2021-12-24 | Discharge: 2021-12-26 | Disposition: A | Discharge status: Home / Self Care | Payer: Self-pay | Attending: Internal Medicine | Admitting: Internal Medicine

## 2021-12-24 ENCOUNTER — Encounter (HOSPITAL_COMMUNITY): Payer: Self-pay | Admitting: Emergency Medicine

## 2021-12-24 DIAGNOSIS — R1012 Left upper quadrant pain: Secondary | ICD-10-CM

## 2021-12-24 DIAGNOSIS — R112 Nausea with vomiting, unspecified: Principal | ICD-10-CM

## 2021-12-24 DIAGNOSIS — F17210 Nicotine dependence, cigarettes, uncomplicated: Secondary | ICD-10-CM | POA: Insufficient documentation

## 2021-12-24 DIAGNOSIS — Z79899 Other long term (current) drug therapy: Secondary | ICD-10-CM | POA: Insufficient documentation

## 2021-12-24 DIAGNOSIS — Z91040 Latex allergy status: Secondary | ICD-10-CM | POA: Insufficient documentation

## 2021-12-24 DIAGNOSIS — J45909 Unspecified asthma, uncomplicated: Secondary | ICD-10-CM | POA: Insufficient documentation

## 2021-12-24 DIAGNOSIS — F17200 Nicotine dependence, unspecified, uncomplicated: Secondary | ICD-10-CM | POA: Diagnosis present

## 2021-12-24 DIAGNOSIS — F1220 Cannabis dependence, uncomplicated: Secondary | ICD-10-CM | POA: Diagnosis present

## 2021-12-24 DIAGNOSIS — K3189 Other diseases of stomach and duodenum: Secondary | ICD-10-CM | POA: Insufficient documentation

## 2021-12-24 DIAGNOSIS — K449 Diaphragmatic hernia without obstruction or gangrene: Secondary | ICD-10-CM | POA: Insufficient documentation

## 2021-12-24 HISTORY — DX: Acute pancreatitis without necrosis or infection, unspecified: K85.90

## 2021-12-24 HISTORY — DX: Cardiac murmur, unspecified: R01.1

## 2021-12-24 LAB — CBC
HCT: 39.8 % (ref 36.0–46.0)
Hemoglobin: 12.9 g/dL (ref 12.0–15.0)
MCH: 31.4 pg (ref 26.0–34.0)
MCHC: 32.4 g/dL (ref 30.0–36.0)
MCV: 96.8 fL (ref 80.0–100.0)
Platelets: 278 10*3/uL (ref 150–400)
RBC: 4.11 MIL/uL (ref 3.87–5.11)
RDW: 12.2 % (ref 11.5–15.5)
WBC: 8.4 10*3/uL (ref 4.0–10.5)
nRBC: 0 % (ref 0.0–0.2)

## 2021-12-24 LAB — COMPREHENSIVE METABOLIC PANEL
ALT: 19 U/L (ref 0–44)
AST: 23 U/L (ref 15–41)
Albumin: 4.1 g/dL (ref 3.5–5.0)
Alkaline Phosphatase: 60 U/L (ref 38–126)
Anion gap: 7 (ref 5–15)
BUN: 9 mg/dL (ref 6–20)
CO2: 22 mmol/L (ref 22–32)
Calcium: 9.4 mg/dL (ref 8.9–10.3)
Chloride: 108 mmol/L (ref 98–111)
Creatinine, Ser: 0.8 mg/dL (ref 0.44–1.00)
GFR, Estimated: >60 mL/min (ref >60)
Glucose, Bld: 103 mg/dL — ABNORMAL HIGH (ref 70–99)
Potassium: 4.1 mmol/L (ref 3.5–5.1)
Sodium: 137 mmol/L (ref 135–145)
Total Bilirubin: 0.6 mg/dL (ref 0.3–1.2)
Total Protein: 7.1 g/dL (ref 6.5–8.1)

## 2021-12-24 LAB — URINALYSIS, ROUTINE W REFLEX MICROSCOPIC
Bilirubin Urine: NEGATIVE
Glucose, UA: NEGATIVE mg/dL
Hgb urine dipstick: NEGATIVE
Ketones, ur: NEGATIVE mg/dL
Nitrite: NEGATIVE
Protein, ur: NEGATIVE mg/dL
Specific Gravity, Urine: 1.024 (ref 1.005–1.030)
pH: 5 (ref 5.0–8.0)

## 2021-12-24 LAB — I-STAT BETA HCG BLOOD, ED (MC, WL, AP ONLY): I-stat hCG, quantitative: <5 m[IU]/mL (ref <5)

## 2021-12-24 LAB — LIPASE, BLOOD: Lipase: 27 U/L (ref 11–51)

## 2021-12-24 MED ORDER — NICOTINE 14 MG/24HR TD PT24
14.0000 mg | MEDICATED_PATCH | Freq: Every day | TRANSDERMAL | Status: DC
  Filled 2021-12-24 (×2): qty 1

## 2021-12-24 MED ORDER — HYDRALAZINE HCL 20 MG/ML IJ SOLN: 5.0000 mg | INTRAMUSCULAR | Status: DC | PRN

## 2021-12-24 MED ORDER — ONDANSETRON HCL 4 MG/2ML IJ SOLN
4.0000 mg | Freq: Once | INTRAMUSCULAR | Status: AC
Start: 2021-12-24 — End: 2021-12-24
  Administered 2021-12-24: 4 mg via INTRAVENOUS
  Filled 2021-12-24: qty 2

## 2021-12-24 MED ORDER — MORPHINE SULFATE (PF) 2 MG/ML IV SOLN
2.0000 mg | INTRAVENOUS | Status: DC | PRN
  Administered 2021-12-24 – 2021-12-25 (×3): 2 mg via INTRAVENOUS
  Filled 2021-12-24 (×3): qty 1

## 2021-12-24 MED ORDER — ACETAMINOPHEN 325 MG PO TABS
650.0000 mg | ORAL_TABLET | Freq: Four times a day (QID) | ORAL | Status: DC | PRN
  Administered 2021-12-25 (×2): 650 mg via ORAL
  Filled 2021-12-24 (×2): qty 2

## 2021-12-24 MED ORDER — METOCLOPRAMIDE HCL 5 MG/ML IJ SOLN
10.0000 mg | Freq: Once | INTRAMUSCULAR | Status: AC
  Administered 2021-12-24: 10 mg via INTRAVENOUS
  Filled 2021-12-24: qty 2

## 2021-12-24 MED ORDER — ALBUTEROL SULFATE HFA 108 (90 BASE) MCG/ACT IN AERS: 2.0000 | INHALATION_SPRAY | RESPIRATORY_TRACT | Status: DC | PRN

## 2021-12-24 MED ORDER — ONDANSETRON HCL 4 MG/2ML IJ SOLN
4.0000 mg | Freq: Four times a day (QID) | INTRAMUSCULAR | Status: DC | PRN
  Administered 2021-12-25: 4 mg via INTRAVENOUS
  Filled 2021-12-24: qty 2

## 2021-12-24 MED ORDER — CAPSAICIN 0.075 % EX CREA
TOPICAL_CREAM | Freq: Two times a day (BID) | CUTANEOUS | Status: DC
  Filled 2021-12-24 (×2): qty 57

## 2021-12-24 MED ORDER — MORPHINE SULFATE (PF) 4 MG/ML IV SOLN
4.0000 mg | Freq: Once | INTRAVENOUS | Status: AC
  Administered 2021-12-24: 4 mg via INTRAVENOUS
  Filled 2021-12-24: qty 1

## 2021-12-24 MED ORDER — SODIUM CHLORIDE 0.9 % IV BOLUS
1000.0000 mL | Freq: Once | INTRAVENOUS | Status: AC
  Administered 2021-12-24: 1000 mL via INTRAVENOUS

## 2021-12-24 MED ORDER — ONDANSETRON HCL 4 MG PO TABS: 4.0000 mg | ORAL_TABLET | Freq: Four times a day (QID) | ORAL | Status: DC | PRN

## 2021-12-24 MED ORDER — PANTOPRAZOLE SODIUM 40 MG IV SOLR
40.0000 mg | Freq: Once | INTRAVENOUS | Status: AC
  Administered 2021-12-24: 40 mg via INTRAVENOUS
  Filled 2021-12-24: qty 10

## 2021-12-24 MED ORDER — ALBUTEROL SULFATE (2.5 MG/3ML) 0.083% IN NEBU
2.5000 mg | INHALATION_SOLUTION | RESPIRATORY_TRACT | Status: DC | PRN
Start: 2021-12-24 — End: 2021-12-26

## 2021-12-24 MED ORDER — ALUM & MAG HYDROXIDE-SIMETH 200-200-20 MG/5ML PO SUSP
30.0000 mL | Freq: Once | ORAL | Status: AC
  Administered 2021-12-24: 30 mL via ORAL
  Filled 2021-12-24: qty 30

## 2021-12-24 MED ORDER — LACTATED RINGERS IV SOLN: INTRAVENOUS | Status: DC

## 2021-12-24 MED ORDER — ACETAMINOPHEN 650 MG RE SUPP: 650.0000 mg | Freq: Four times a day (QID) | RECTAL | Status: DC | PRN

## 2021-12-24 NOTE — ED Triage Notes (Signed)
Pt states is having pain in her pancreas again. She has a hx of pancreatitis. This flare up began last night. Denies any recent oily foods or alcohol. Pt feels nauseous but no vomiting.

## 2021-12-24 NOTE — ED Provider Notes (Signed)
Natalie [name] [facility] EMERGENCY DEPARTMENT Provider Note   CSN: [id number] Arrival date & time: 12/24/21  5284     History  Chief Complaint  Patient presents with   Abdominal Pain    Natalie [name] is a 41 y.o. female with a history of asthma, ectopic pregnancy, alcohol induced pancreatitis.  Presents emergency department complaint of left upper quadrant abdominal pain.  Patient reports that pain started approximately 9 PM last night.  Pain is located to her left upper quadrant does not radiate.  Patient describes pain as "aggravating."  Patient states that pain feels similar to episodes of pancreatitis she has had in the past.  Patient reports taking Tylenol 3 and oxycodone at home with no relief of symptoms.  Patient also endorses nausea.  Patient has not had any episodes of vomiting.  Last bowel movement was yesterday.  Patient states that yesterday she did have fried food.  Patient has slowly been into reintroducing fried food into her diet.  Patient states that she has had episodes of pain in the past after eating fried food however the pain usually resolves.  Patient denies any constipation, diarrhea, blood in stool, melena, dysuria, hematuria, urinary frequency, fever, chills.  Patient denies any frequent NSAID use, alcohol use.  Patient does endorse daily marijuana use.   Abdominal Pain Associated symptoms: nausea   Associated symptoms: no chest pain, no chills, no constipation, no diarrhea, no dysuria, no fever, no hematuria, no shortness of breath, no vaginal bleeding, no vaginal discharge and no vomiting        Home Medications Prior to Admission medications   Medication Sig Start Date End Date Taking? Authorizing Provider  acetaminophen (TYLENOL) 500 MG tablet Take 1 tablet (500 mg total) by mouth every 8 (eight) hours as needed for headache, mild pain or fever. 10/24/21   Rolly Salter, MD  diphenhydrAMINE HCl (ALLERGY MED PO) Take 1 tablet by mouth daily as  needed (allergies).    [provider]  Naphazoline-Pheniramine (ALLERGY EYE OP) Place 1 drop into both eyes daily as needed (red/itchy eyes).    [provider]  oxyCODONE-acetaminophen (PERCOCET) 5-325 MG tablet Take 1 tablet by mouth every 8 (eight) hours as needed for severe pain or moderate pain. Patient not taking: Reported on 11/02/2021 10/24/21 10/24/22  Rolly Salter, MD  polyethylene glycol powder [facility]) 17 GM/SCOOP powder Take 1 capful with water (17 g) by mouth daily. 10/25/21   Rolly Salter, MD  thiamine 100 MG tablet Take 1 tablet (100 mg total) by mouth daily. 10/25/21   Rolly Salter, MD      Allergies    Fish allergy, Flagyl [metronidazole], Iodine, Latex, Oxycodone, Shellfish allergy, and Strawberry flavor    Review of Systems   Review of Systems  Constitutional:  Negative for chills and fever.  Eyes:  Negative for visual disturbance.  Respiratory:  Negative for shortness of breath.   Cardiovascular:  Negative for chest pain.  Gastrointestinal:  Positive for abdominal pain and nausea. Negative for abdominal distention, anal bleeding, blood in stool, constipation, diarrhea, rectal pain and vomiting.  Genitourinary:  Negative for difficulty urinating, dysuria, flank pain, frequency, genital sores, hematuria, menstrual problem, pelvic pain, urgency, vaginal bleeding, vaginal discharge and vaginal pain.  Musculoskeletal:  Negative for back pain and neck pain.  Skin:  Negative for color change and rash.  Neurological:  Negative for dizziness, syncope, light-headedness and headaches.  Psychiatric/Behavioral:  Negative for confusion.     Physical Exam  Updated Vital Signs BP 130/70 (BP Location: Left Arm)   Pulse 60   Temp 98 F (36.7 C) (Oral)   Resp 16   SpO2 100%  Physical Exam Vitals and nursing note reviewed.  Constitutional:      General: She is not in acute distress.    Appearance: She is not ill-appearing, toxic-appearing or  diaphoretic.  HENT:     Head: Normocephalic.  Eyes:     General: No scleral icterus.       Right eye: No discharge.        Left eye: No discharge.  Cardiovascular:     Rate and Rhythm: Normal rate.  Pulmonary:     Effort: Pulmonary effort is normal.  Abdominal:     General: Bowel sounds are normal. There is no distension. There are no signs of injury.     Palpations: Abdomen is soft. There is no mass or pulsatile mass.     Tenderness: There is abdominal tenderness in the epigastric area, left upper quadrant and left lower quadrant. There is no right CVA tenderness, left CVA tenderness, guarding or rebound.     Hernia: There is no hernia in the umbilical area or ventral area.  Skin:    General: Skin is warm and dry.  Neurological:     General: No focal deficit present.     Mental Status: She is alert.  Psychiatric:        Behavior: Behavior is cooperative.     ED Results / Procedures / Treatments   Labs (all labs ordered are listed, but only abnormal results are displayed) Labs Reviewed  COMPREHENSIVE METABOLIC PANEL - Abnormal; Notable for the following components:      Result Value   Glucose, Bld 103 (*)    All other components within normal limits  URINALYSIS, ROUTINE W REFLEX MICROSCOPIC - Abnormal; Notable for the following components:   APPearance CLOUDY (*)    Leukocytes,Ua MODERATE (*)    Bacteria, UA FEW (*)    All other components within normal limits  LIPASE, BLOOD  CBC  I-STAT BETA HCG BLOOD, ED ([facility], [facility], [facility] ONLY)    EKG None  Radiology No results found.  Procedures Procedures    Medications Ordered in ED Medications  lactated ringers infusion (has no administration in time range)  acetaminophen (TYLENOL) tablet 650 mg (has no administration in time range)    Or  acetaminophen (TYLENOL) suppository 650 mg (has no administration in time range)  morphine (PF) 2 MG/ML injection 2 mg (has no administration in time range)  ondansetron (ZOFRAN) tablet  4 mg (has no administration in time range)    Or  ondansetron (ZOFRAN) injection 4 mg (has no administration in time range)  nicotine (NICODERM CQ - dosed in mg/24 hours) patch 14 mg (has no administration in time range)  hydrALAZINE (APRESOLINE) injection 5 mg (has no administration in time range)  albuterol (PROVENTIL) (2.5 MG/3ML) 0.083% nebulizer solution 2.5 mg (has no administration in time range)  ondansetron (ZOFRAN) injection 4 mg (4 mg Intravenous Given 12/24/21 1149)  morphine (PF) 4 MG/ML injection 4 mg (4 mg Intravenous Given 12/24/21 1152)  pantoprazole (PROTONIX) injection 40 mg (40 mg Intravenous Given 12/24/21 1150)  alum & mag hydroxide-simeth (MAALOX/MYLANTA) 200-200-20 MG/5ML suspension 30 mL (30 mLs Oral Given 12/24/21 1423)  sodium chloride 0.9 % bolus 1,000 mL (1,000 mLs Intravenous New Bag/Given 12/24/21 1451)  metoCLOPramide (REGLAN) injection 10 mg (10 mg Intravenous Given 12/24/21 1453)  morphine (PF) 4  MG/ML injection 4 mg (4 mg Intravenous Given 12/24/21 1454)    ED Course/ Medical Decision Making/ A&P                           Medical Decision Making Amount and/or Complexity of Data Reviewed Labs: ordered.  Risk OTC drugs. Prescription drug management. Decision regarding hospitalization.   Alert 41 year old female in no acute distress, nontoxic-appearing.  Presents the emergency department with a chief complaint of abdominal pain and nausea  Information was obtained from patient.  I reviewed patient's past medical records including previous phone notes, admission/discharge notes, labs and images.  Patient has medical history as outlined in HPI which complicates her care.  With reports of abdominal pain and nausea ED abdominal lab order set was placed.  Will order morphine, Protonix, and Zofran at this time.  I personally viewed and interpreted patient's lab results.  Pertinent findings include: -CMP, CBC, lipase unremarkable -UA shows bacteria few, leukocyte  moderate, nitrite negative; low suspicion for infection as there is significant contamination from squamous epithelial cells.  Patient reported minimal improvement in pain and nausea after receiving Zofran and morphine.  Attempted to p.o. challenge patient however she was unable to tolerate any p.o. intake.  We will give patient Reglan and consult hospitalist team for admission for intractable nausea and vomiting.  I spoke to hospitalist Dr. Ophelia Charter who will see the patient for admission.  Patient care was discussed with attending physician Dr.Trifan.        Final Clinical Impression(s) / ED Diagnoses Final diagnoses:  Intractable nausea and vomiting  Left upper quadrant abdominal pain    Rx / DC Orders ED Discharge Orders     None         Haskel Schroeder, PA-C 12/24/21 1627    Terald Sleeper, MD 12/24/21 [phone]

## 2021-12-24 NOTE — Progress Notes (Signed)
Please be mindful that we are getting 5 patients from the ED. Thank you and we appreciate you.

## 2021-12-24 NOTE — H&P (Signed)
History and Physical    Patient: Natalie [name] WYO:[id number] DOB: 1981/03/12 DOA: 12/24/2021 DOS: the patient was seen and examined on 12/24/2021 PCP: Orion Crook I, NP  Patient coming from: Home - lives with roommate; NOK: Kateri Plummer, [phone]   Chief Complaint: abdominal pain  HPI: Natalie [name] is a 41 y.o. female with medical history significant of alcoholic pancreatitis presenting with abdominal pain.  She reports no ETOH since last hospitalization.  Also no dietary changes.  She was in her usual state of health until about 9pm last night, when she developed acute LUQ pain.  She further developed n/v and has been unable to tolerate even sips of water in the ER.  No diarrhea.  No sick contacts.    ER Course:  Intractable n/v.  H/o alcoholic pancreatitis, admitted in May for the same.  No ETOH since but slowly reintroducing fried foods into diet and had some yesterday.  Normal labs/studies.  Had ongoing n/v with fluids.     Review of Systems: As mentioned in the history of present illness. All other systems reviewed and are negative. Past Medical History:  Diagnosis Date   Asthma    Ectopic pregnancy    Heart murmur    Pancreatitis    Past Surgical History:  Procedure Laterality Date   partial hysterectomy     TUBAL LIGATION     Social History:  reports that she has been smoking cigarettes. She has been smoking an average of .5 packs per day. She has never used smokeless tobacco. She reports that she does not currently use alcohol. She reports current drug use. Drug: Marijuana.  Allergies  Allergen Reactions   Fish Allergy Anaphylaxis and Other (See Comments)    Pt stated fish causes her to go into cardiac arrest   Flagyl [Metronidazole]    Iodine    Latex    Oxycodone Itching    Pt stated it caused her to be confused    Shellfish Allergy Hives and Itching   Strawberry Flavor     Family History  Problem Relation Age of Onset   Heart  murmur Mother    Arthritis Mother    Arthritis Father    Heart murmur Father    Depression Maternal Aunt    Cancer Paternal Grandfather     Prior to Admission medications   Medication Sig Start Date End Date Taking? Authorizing Provider  acetaminophen (TYLENOL) 500 MG tablet Take 1 tablet (500 mg total) by mouth every 8 (eight) hours as needed for headache, mild pain or fever. 10/24/21  Yes Rolly Salter, MD  albuterol (VENTOLIN HFA) 108 (90 Base) MCG/ACT inhaler Inhale 2 puffs into the lungs every 4 (four) hours as needed for wheezing or shortness of breath.   Yes [provider]  diphenhydrAMINE HCl (ALLERGY MED PO) Take 1 tablet by mouth daily as needed (allergies).   Yes [provider]  Naphazoline-Pheniramine (ALLERGY EYE OP) Place 1 drop into both eyes daily as needed (red/itchy eyes).   Yes [provider]  polyethylene glycol powder (GLYCOLAX/MIRALAX) 17 GM/SCOOP powder Take 1 capful with water (17 g) by mouth daily. 10/25/21  Yes Rolly Salter, MD  oxyCODONE-acetaminophen (PERCOCET) 5-325 MG tablet Take 1 tablet by mouth every 8 (eight) hours as needed for severe pain or moderate pain. Patient not taking: Reported on 11/02/2021 10/24/21 10/24/22  Rolly Salter, MD  thiamine 100 MG tablet Take 1 tablet (100 mg total) by mouth daily. Patient not  taking: Reported on 12/24/2021 10/25/21   Rolly Salter, MD    Physical Exam: Vitals:   12/24/21 1010 12/24/21 1428 12/24/21 1716 12/24/21 1733  BP: 130/70 110/60 (!) 107/56 (!) 113/94  Pulse: 60 63 (!) 54 93  Resp: 16 16 16 16   Temp: 98 F (36.7 C)   98.1 F (36.7 C)  TempSrc: Oral   Oral  SpO2: 100% 100% 100% 100%  Weight:    65.4 kg  Height:    5' 9.5" (1.765 m)   General:  Appears calm and comfortable and is in NAD Eyes:  EOMI, normal lids, iris ENT:  grossly normal hearing, lips & tongue, mmm; +tongue ring Neck:  no LAD, masses or thyromegaly Cardiovascular:  RRR, no m/r/g. No LE edema.   Respiratory:   CTA bilaterally with no wheezes/rales/rhonchi.  Normal respiratory effort. Abdomen:  soft, LUQ TTP, ND Skin:  no rash or induration seen on limited exam Musculoskeletal:  grossly normal tone BUE/BLE, good ROM, no bony abnormality Psychiatric:  grossly normal mood and affect, speech fluent and appropriate, AOx3 Neurologic:  CN 2-12 grossly intact, moves all extremities in coordinated fashion  Radiological Exams on Admission: Independently reviewed - see discussion in A/P where applicable  No results found.  EKG: not done   Labs on Admission: I have personally reviewed the available labs and imaging studies at the time of the admission.  Pertinent labs:    Normal CMP Normal CBC UA moderate LE and few bacteria   Assessment and Plan: Principal Problem:   Intractable vomiting with nausea Active Problems:   Marijuana dependence (HCC)   Nicotine dependence    Intractable n/v with LUQ pain -Patient with h/o alcoholic pancreatitis -Reports no further ETOH since last hospitalization -Developed acute onset of LUQ pain and n/v last night -Normal labs -No evidence of dehydration  -Imaging does not appear to be currently indicated -Will observe on med surg -IVF hydration -Zofran -Sips/chips if tolerated -Anticipate short hospitalization unless new issues arise  Marijuana dependence -Reports daily use -Cannabinoid hyperemesis syndrome is a consideration -Cessation encouraged -Will order Zostrix  Tobacco dependence -Encourage cessation.   -This was discussed with the patient and should be reviewed on an ongoing basis.   -Patch ordered    Advance Care Planning:   Code Status: Full Code   Consults: None  DVT Prophylaxis: Lovenox  Family Communication: None present; she is capable of communicating with family at this time  Severity of Illness: The appropriate patient status for this patient is OBSERVATION. Observation status is judged to be reasonable  and necessary in order to provide the required intensity of service to ensure the patient's safety. The patient's presenting symptoms, physical exam findings, and initial radiographic and laboratory data in the context of their medical condition is felt to place them at decreased risk for further clinical deterioration. Furthermore, it is anticipated that the patient will be medically stable for discharge from the hospital within 2 midnights of admission.   Author: , MD 12/24/2021 6:51 PM  For on call review [email or web address].

## 2021-12-25 ENCOUNTER — Observation Stay (HOSPITAL_COMMUNITY): Payer: Self-pay

## 2021-12-25 DIAGNOSIS — K862 Cyst of pancreas: Secondary | ICD-10-CM

## 2021-12-25 DIAGNOSIS — R1084 Generalized abdominal pain: Secondary | ICD-10-CM

## 2021-12-25 DIAGNOSIS — Z8719 Personal history of other diseases of the digestive system: Secondary | ICD-10-CM

## 2021-12-25 LAB — CBC
HCT: 36.3 % (ref 36.0–46.0)
Hemoglobin: 11.8 g/dL — ABNORMAL LOW (ref 12.0–15.0)
MCH: 30.9 pg (ref 26.0–34.0)
MCHC: 32.5 g/dL (ref 30.0–36.0)
MCV: 95 fL (ref 80.0–100.0)
Platelets: 233 10*3/uL (ref 150–400)
RBC: 3.82 MIL/uL — ABNORMAL LOW (ref 3.87–5.11)
RDW: 12.1 % (ref 11.5–15.5)
WBC: 8.9 10*3/uL (ref 4.0–10.5)
nRBC: 0 % (ref 0.0–0.2)

## 2021-12-25 LAB — COMPREHENSIVE METABOLIC PANEL
ALT: 16 U/L (ref 0–44)
AST: 18 U/L (ref 15–41)
Albumin: 3.5 g/dL (ref 3.5–5.0)
Alkaline Phosphatase: 50 U/L (ref 38–126)
Anion gap: 4 — ABNORMAL LOW (ref 5–15)
BUN: 6 mg/dL (ref 6–20)
CO2: 23 mmol/L (ref 22–32)
Calcium: 9 mg/dL (ref 8.9–10.3)
Chloride: 110 mmol/L (ref 98–111)
Creatinine, Ser: 0.71 mg/dL (ref 0.44–1.00)
GFR, Estimated: >60 mL/min (ref >60)
Glucose, Bld: 96 mg/dL (ref 70–99)
Potassium: 4 mmol/L (ref 3.5–5.1)
Sodium: 137 mmol/L (ref 135–145)
Total Bilirubin: 1.3 mg/dL — ABNORMAL HIGH (ref 0.3–1.2)
Total Protein: 6 g/dL — ABNORMAL LOW (ref 6.5–8.1)

## 2021-12-25 MED ORDER — TRAMADOL HCL 50 MG PO TABS
50.0000 mg | ORAL_TABLET | Freq: Four times a day (QID) | ORAL | Status: DC | PRN
  Administered 2021-12-25 – 2021-12-26 (×3): 50 mg via ORAL
  Filled 2021-12-25 (×3): qty 1

## 2021-12-25 MED ORDER — DIPHENHYDRAMINE HCL 50 MG/ML IJ SOLN
25.0000 mg | Freq: Once | INTRAMUSCULAR | Status: AC
  Administered 2021-12-25: 25 mg via INTRAVENOUS
  Filled 2021-12-25: qty 1

## 2021-12-25 MED ORDER — DIPHENHYDRAMINE HCL 25 MG PO CAPS
50.0000 mg | ORAL_CAPSULE | Freq: Once | ORAL | Status: AC
Start: 2021-12-25 — End: 2021-12-25

## 2021-12-25 MED ORDER — IOHEXOL 300 MG/ML  SOLN
100.0000 mL | Freq: Once | INTRAMUSCULAR | Status: AC | PRN
  Administered 2021-12-25: 100 mL via INTRAVENOUS

## 2021-12-25 MED ORDER — PANTOPRAZOLE SODIUM 40 MG PO TBEC
40.0000 mg | DELAYED_RELEASE_TABLET | Freq: Two times a day (BID) | ORAL | Status: DC
  Administered 2021-12-25 – 2021-12-26 (×2): 40 mg via ORAL
  Filled 2021-12-25 (×2): qty 1

## 2021-12-25 MED ORDER — METHYLPREDNISOLONE SODIUM SUCC 125 MG IJ SOLR
40.0000 mg | Freq: Once | INTRAMUSCULAR | Status: AC
Start: 2021-12-25 — End: 2021-12-25
  Administered 2021-12-25: 40 mg via INTRAVENOUS
  Filled 2021-12-25: qty 2

## 2021-12-25 MED ORDER — PANTOPRAZOLE SODIUM 40 MG IV SOLR
40.0000 mg | Freq: Two times a day (BID) | INTRAVENOUS | Status: DC
  Administered 2021-12-25: 40 mg via INTRAVENOUS
  Filled 2021-12-25: qty 10

## 2021-12-25 MED ORDER — METHYLPREDNISOLONE SODIUM SUCC 40 MG IJ SOLR
25.0000 mg | Freq: Once | INTRAMUSCULAR | Status: AC
  Administered 2021-12-25: 25 mg via INTRAVENOUS
  Filled 2021-12-25 (×2): qty 0.63

## 2021-12-25 MED ORDER — DIPHENHYDRAMINE HCL 50 MG/ML IJ SOLN
50.0000 mg | Freq: Once | INTRAMUSCULAR | Status: AC
Start: 2021-12-25 — End: 2021-12-25
  Administered 2021-12-25: 50 mg via INTRAVENOUS
  Filled 2021-12-25: qty 1

## 2021-12-25 MED ORDER — DIPHENHYDRAMINE HCL 50 MG/ML IJ SOLN
25.0000 mg | Freq: Four times a day (QID) | INTRAMUSCULAR | Status: AC | PRN
  Administered 2021-12-25 – 2021-12-26 (×2): 25 mg via INTRAVENOUS
  Filled 2021-12-25 (×2): qty 1

## 2021-12-25 MED ORDER — LACTATED RINGERS IV SOLN: INTRAVENOUS | Status: AC
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
## 2021-12-25 NOTE — Progress Notes (Signed)
PROGRESS NOTE                                                                                                                                                                                                             Patient Demographics:    Natalie [name], is a 41 y.o. female, DOB - 13-Sep-1980, IOE:[id number]  Outpatient Primary MD for the patient is Orion Crook I, NP    LOS - 0  Admit date - 12/24/2021    Chief Complaint  Patient presents with   Abdominal Pain       Brief Narrative (HPI from H&P)   41 y.o. female with medical history significant of alcoholic pancreatitis presenting with abdominal pain.  She reports no ETOH since last hospitalization.  Also no dietary changes.  She was in her usual state of health until about 9pm last night, when she developed acute LUQ pain.  She further developed n/v and has been unable to tolerate even sips of water in the ER.  No diarrhea.  No sick contacts.  She was admitted for further evaluation and work-up for ongoing left upper quadrant abdominal pain.   Subjective:    Natalie [name] today has, No headache, No chest pain, improved nonradiating left upper quadrant abdominal pain, no nausea, No new weakness tingling or numbness, no SOB   Assessment  & Plan :    Nonradiating left upper quadrant abdominal pain with nausea vomiting.  She has history of alcoholic pancreatitis but has had no alcohol in several mths, lipase was unremarkable, which PPI her symptoms have improved, for now we will continue n.p.o., continue IV PPI, obtain CT scan abdomen pelvis with IV contrast, request GI to evaluate for possible EGD to rule out gastritis.  2.  Ongoing history of smoking and marijuana use.  Counseled to quit, she has quit alcohol several mths ago and is not drinking any at all.  Requested to continue to abstain.       Condition - Fair  Family Communication  :  None  Code Status :   Full  Consults  :  GI  PUD Prophylaxis : PPI   Procedures  :     CT Abd - Pelvis -       Disposition Plan  :    Status is: Observation  DVT Prophylaxis  :  SCDs Start: 12/24/21 1554  Lab Results  Component Value Date   PLT 233 12/25/2021    Diet :  Diet Order             Diet NPO time specified Except for: Sips with Meds, Ice Chips  Diet effective now                    Inpatient Medications  Scheduled Meds:  capsicum   Topical BID   nicotine  14 mg Transdermal Daily   pantoprazole (PROTONIX) IV  40 mg Intravenous Q12H   Continuous Infusions:  lactated ringers     PRN Meds:.acetaminophen **OR** acetaminophen, albuterol, hydrALAZINE, morphine injection, ondansetron **OR** ondansetron (ZOFRAN) IV  Antibiotics  :    Anti-infectives (From admission, onward)    None        Time Spent in minutes  30   Susa Raring M.D on 12/25/2021 at 8:08 AM  To page go to [email or web address]   [facility] -  Office  [phone]  See all Orders from today for further details    Objective:   Vitals:   12/24/21 1716 12/24/21 1733 12/24/21 1933 12/25/21 0804  BP: (!) 107/56 (!) 113/94 103/68 116/74  Pulse: (!) 54 93 (!) 49 (!) 57  Resp: 16 16  17   Temp:  98.1 F (36.7 C) 98.5 F (36.9 C) 97.8 F (36.6 C)  TempSrc:  Oral Oral Oral  SpO2: 100% 100% 100% 100%  Weight:  65.4 kg    Height:  5' 9.5" (1.765 m)      Wt Readings from Last 3 Encounters:  12/24/21 65.4 kg  11/02/21 68.2 kg  10/24/21 65.8 kg     Intake/Output Summary (Last 24 hours) at 12/25/2021 02/24/2022 Last data filed at 12/25/2021 0500 Gross per 24 hour  Intake 2177.84 ml  Output --  Net 2177.84 ml     Physical Exam  Awake Alert, No new F.N deficits, Normal affect [place].AT,PERRAL Supple Neck, No JVD,   Symmetrical Chest wall movement, Good air movement bilaterally, CTAB RRR,No Gallops,Rubs or new Murmurs,  +ve B.Sounds, Abd Soft, +ve LUQ tenderness,   No Cyanosis, Clubbing or  edema      Data Review:    CBC Recent Labs  Lab 12/24/21 0726 12/25/21 0251  WBC 8.4 8.9  HGB 12.9 11.8*  HCT 39.8 36.3  PLT 278 233  MCV 96.8 95.0  MCH 31.4 30.9  MCHC 32.4 32.5  RDW 12.2 12.1    Electrolytes Recent Labs  Lab 12/24/21 0726 12/25/21 0251  NA 137 137  K 4.1 4.0  CL 108 110  CO2 22 23  GLUCOSE 103* 96  BUN 9 6  CREATININE 0.80 0.71  CALCIUM 9.4 9.0  AST 23 18  ALT 19 16  ALKPHOS 60 50  BILITOT 0.6 1.3*  ALBUMIN 4.1 3.5   Radiology Reports No results found.
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
## 2021-12-26 ENCOUNTER — Observation Stay (HOSPITAL_COMMUNITY): Payer: Self-pay

## 2021-12-26 ENCOUNTER — Encounter (HOSPITAL_COMMUNITY)
Admission: EM | Disposition: A | Discharge status: Home / Self Care | Payer: Self-pay | Source: Home / Self Care | Attending: Emergency Medicine

## 2021-12-26 ENCOUNTER — Other Ambulatory Visit (HOSPITAL_COMMUNITY): Payer: Self-pay

## 2021-12-26 ENCOUNTER — Observation Stay (HOSPITAL_BASED_OUTPATIENT_CLINIC_OR_DEPARTMENT_OTHER): Payer: Self-pay | Admitting: Anesthesiology

## 2021-12-26 ENCOUNTER — Observation Stay (HOSPITAL_COMMUNITY): Payer: Self-pay | Admitting: Anesthesiology

## 2021-12-26 ENCOUNTER — Encounter (HOSPITAL_COMMUNITY): Payer: Self-pay | Admitting: Internal Medicine

## 2021-12-26 DIAGNOSIS — K3189 Other diseases of stomach and duodenum: Secondary | ICD-10-CM

## 2021-12-26 DIAGNOSIS — K759 Inflammatory liver disease, unspecified: Secondary | ICD-10-CM

## 2021-12-26 DIAGNOSIS — K449 Diaphragmatic hernia without obstruction or gangrene: Secondary | ICD-10-CM

## 2021-12-26 DIAGNOSIS — J45909 Unspecified asthma, uncomplicated: Secondary | ICD-10-CM

## 2021-12-26 HISTORY — PX: BIOPSY: SHX5522

## 2021-12-26 HISTORY — PX: ESOPHAGOGASTRODUODENOSCOPY (EGD) WITH PROPOFOL: SHX5813

## 2021-12-26 LAB — COMPREHENSIVE METABOLIC PANEL
ALT: 15 U/L (ref 0–44)
AST: 17 U/L (ref 15–41)
Albumin: 3.5 g/dL (ref 3.5–5.0)
Alkaline Phosphatase: 49 U/L (ref 38–126)
Anion gap: 6 (ref 5–15)
BUN: 6 mg/dL (ref 6–20)
CO2: 23 mmol/L (ref 22–32)
Calcium: 9.2 mg/dL (ref 8.9–10.3)
Chloride: 108 mmol/L (ref 98–111)
Creatinine, Ser: 0.77 mg/dL (ref 0.44–1.00)
GFR, Estimated: >60 mL/min (ref >60)
Glucose, Bld: 103 mg/dL — ABNORMAL HIGH (ref 70–99)
Potassium: 3.5 mmol/L (ref 3.5–5.1)
Sodium: 137 mmol/L (ref 135–145)
Total Bilirubin: 1 mg/dL (ref 0.3–1.2)
Total Protein: 6.3 g/dL — ABNORMAL LOW (ref 6.5–8.1)

## 2021-12-26 LAB — CBC
HCT: 32.5 % — ABNORMAL LOW (ref 36.0–46.0)
Hemoglobin: 11 g/dL — ABNORMAL LOW (ref 12.0–15.0)
MCH: 31.5 pg (ref 26.0–34.0)
MCHC: 33.8 g/dL (ref 30.0–36.0)
MCV: 93.1 fL (ref 80.0–100.0)
Platelets: 237 10*3/uL (ref 150–400)
RBC: 3.49 MIL/uL — ABNORMAL LOW (ref 3.87–5.11)
RDW: 12.1 % (ref 11.5–15.5)
WBC: 15.7 10*3/uL — ABNORMAL HIGH (ref 4.0–10.5)
nRBC: 0 % (ref 0.0–0.2)

## 2021-12-26 LAB — LIPASE, BLOOD: Lipase: 19 U/L (ref 11–51)

## 2021-12-26 SURGERY — ESOPHAGOGASTRODUODENOSCOPY (EGD) WITH PROPOFOL
Anesthesia: Monitor Anesthesia Care

## 2021-12-26 MED ORDER — SUCRALFATE 1 GM/10ML PO SUSP: 1.0000 g | Freq: Two times a day (BID) | ORAL | Status: DC

## 2021-12-26 MED ORDER — PANTOPRAZOLE SODIUM 40 MG PO TBEC
40.0000 mg | DELAYED_RELEASE_TABLET | Freq: Every day | ORAL | 1 refills | Status: DC
  Filled 2021-12-26 – 2022-01-20 (×2): qty 30, 30d supply, fill #0

## 2021-12-26 MED ORDER — SUCRALFATE 1 GM/10ML PO SUSP
1.0000 g | Freq: Three times a day (TID) | ORAL | Status: DC
Start: 2021-12-26 — End: 2021-12-26

## 2021-12-26 MED ORDER — PANTOPRAZOLE SODIUM 40 MG PO TBEC: 40.0000 mg | DELAYED_RELEASE_TABLET | Freq: Every day | ORAL | Status: DC

## 2021-12-26 MED ORDER — DIPHENHYDRAMINE HCL 25 MG PO CAPS
25.0000 mg | ORAL_CAPSULE | Freq: Three times a day (TID) | ORAL | Status: DC | PRN
  Administered 2021-12-26: 25 mg via ORAL
  Filled 2021-12-26: qty 1

## 2021-12-26 MED ORDER — ONDANSETRON HCL 4 MG PO TABS
4.0000 mg | ORAL_TABLET | Freq: Three times a day (TID) | ORAL | 0 refills | Status: DC | PRN
  Filled 2021-12-26: qty 20, 7d supply, fill #0

## 2021-12-26 MED ORDER — PROPOFOL 500 MG/50ML IV EMUL
INTRAVENOUS | Status: DC | PRN
  Administered 2021-12-26: 100 ug/kg/min via INTRAVENOUS

## 2021-12-26 MED ORDER — LACTATED RINGERS IV SOLN
INTRAVENOUS | Status: AC | PRN
  Administered 2021-12-26: 10 mL/h via INTRAVENOUS

## 2021-12-26 MED ORDER — PROPOFOL 10 MG/ML IV BOLUS
INTRAVENOUS | Status: DC | PRN
  Administered 2021-12-26: 20 mg via INTRAVENOUS

## 2021-12-26 MED ORDER — PHENYLEPHRINE 80 MCG/ML (10ML) SYRINGE FOR IV PUSH (FOR BLOOD PRESSURE SUPPORT)
PREFILLED_SYRINGE | INTRAVENOUS | Status: DC | PRN
  Administered 2021-12-26 (×3): 80 ug via INTRAVENOUS

## 2021-12-26 MED ORDER — LIDOCAINE 2% (20 MG/ML) 5 ML SYRINGE
INTRAMUSCULAR | Status: DC | PRN
  Administered 2021-12-26: 60 mg via INTRAVENOUS

## 2021-12-26 MED ORDER — SUCRALFATE 1 G PO TABS
1.0000 g | ORAL_TABLET | Freq: Two times a day (BID) | ORAL | 0 refills | Status: DC
  Filled 2021-12-26: qty 42, 21d supply, fill #0

## 2021-12-26 SURGICAL SUPPLY — 15 items

## 2021-12-26 NOTE — Transfer of Care (Signed)
Immediate Anesthesia Transfer of Care Note  Patient: Natalie [name]  Procedure(s) Performed: ESOPHAGOGASTRODUODENOSCOPY (EGD) WITH PROPOFOL BIOPSY  Patient Location: Endoscopy Unit  Anesthesia Type:MAC  Level of Consciousness: awake, alert  and oriented  Airway & Oxygen Therapy: Patient Spontanous Breathing  Post-op Assessment: Report given to RN, Post -op Vital signs reviewed and stable and Patient moving all extremities X 4  Post vital signs: Reviewed and stable  Last Vitals:  Vitals Value Taken Time  BP 111/68 12/26/21 1241  Temp 36.6 C 12/26/21 1241  Pulse 57 12/26/21 1244  Resp 17 12/26/21 1245  SpO2 98 % 12/26/21 1244  Vitals shown include unvalidated device data.  Last Pain:  Vitals:   12/26/21 1241  TempSrc: Temporal  PainSc: 3          Complications: No notable events documented.

## 2021-12-26 NOTE — Interval H&P Note (Signed)
History and Physical Interval Note:  12/26/2021 12:11 PM  Natalie [name]  has presented today for surgery, with the diagnosis of Acute left upper quadrant pain in patient with history pancreatitis but currently normal lipase, CT is pending.  Nausea, bilious vomiting.  The various methods of treatment have been discussed with the patient and family. After consideration of risks, benefits and other options for treatment, the patient has consented to  Procedure(s): ESOPHAGOGASTRODUODENOSCOPY (EGD) WITH PROPOFOL (N/A) as a surgical intervention.  The patient's history has been reviewed, patient examined, no change in status, stable for surgery.  I have reviewed the patient's chart and labs.  Questions were answered to the patient's satisfaction.     [employer]

## 2021-12-26 NOTE — Op Note (Signed)
[facility] Patient Name: Natalie [name] Procedure Date : 12/26/2021 MRN: [id number] Attending MD: Justice Britain , MD Date of Birth: [birth year] CSN: [id number] Age: 41 Admit Type: Inpatient Procedure:                Upper GI endoscopy Indications:              Generalized abdominal pain, Nausea with vomiting Providers:                Justice Britain, MD, William Dalton,                            Technician, Allayne Gitelman, RN Referring MD:             [facility] Medicines:                Monitored Anesthesia Care Complications:            No immediate complications. Estimated Blood Loss:     Estimated blood loss was minimal. Procedure:                Pre-Anesthesia Assessment:                           - Prior to the procedure, a History and Physical                            was performed, and patient medications and                            allergies were reviewed. The patient's tolerance of                            previous anesthesia was also reviewed. The risks                            and benefits of the procedure and the sedation                            options and risks were discussed with the patient.                            All questions were answered, and informed consent                            was obtained. Prior Anticoagulants: The patient has                            taken no previous anticoagulant or antiplatelet                            agents. ASA Grade Assessment: II - A patient with                            mild systemic disease. After reviewing the risks  and benefits, the patient was deemed in                            satisfactory condition to undergo the procedure.                           After obtaining informed consent, the endoscope was                            passed under direct vision. Throughout the                            procedure, the patient's blood pressure, pulse, and                             oxygen saturations were monitored continuously. The                            [id number]-H190 ([id number]) Olympus endoscope was introduced                            through the mouth, and advanced to the second part                            of duodenum. The upper GI endoscopy was                            accomplished without difficulty. The patient                            tolerated the procedure. Scope In: Scope Out: Findings:      No gross lesions were noted in the entire esophagus.      The Z-line was regular and was found 41 cm from the incisors.      A 3 cm hiatal hernia was present.      Patchy mildly erythematous mucosa without bleeding was found in the       entire examined stomach. Biopsies were taken with a cold forceps for       histology and Helicobacter pylori testing.      No gross lesions were noted in the duodenal bulb, in the first portion       of the duodenum and in the second portion of the duodenum. Biopsies were       taken with a cold forceps for histology. Impression:               - No gross lesions in esophagus.                           - Z-line regular, 41 cm from the incisors.                           - 3 cm hiatal hernia.                           - Erythematous mucosa in the stomach. Biopsied.                           -   No gross lesions in the duodenal bulb, in the                            first portion of the duodenum and in the second                            portion of the duodenum. Biopsied. Recommendation:           - The patient will be observed post-procedure,                            until all discharge criteria are met.                           - Return patient to hospital ward for ongoing care.                           - Advance diet as tolerated.                           - Observe patient's clinical course.                           - Continue PPI 40 mg daily.                           - Carafate twice  daily for 2-4 weeks.                           - Continue present medications otherwise.                           - Await pathology results.                           - Continue minimal alcohol intake to decrease risk                            of issues.                           - No overt gallstone disease, but as she advanced                            her diet to include more fatty/greasy foods she had                            more significant discomfort, so recommend to remain                            on a low-fat, heart healthy diet to minimize issues                            as possible.                           -  The findings and recommendations were discussed                            with the patient.                           - The findings and recommendations were discussed                            with the referring physician. Procedure Code(s):        --- Professional ---                           [phone], Esophagogastroduodenoscopy, flexible,                            transoral; with biopsy, single or multiple Diagnosis Code(s):        --- Professional ---                           K44.9, Diaphragmatic hernia without obstruction or                            gangrene                           K31.89, Other diseases of stomach and duodenum                           R10.84, Generalized abdominal pain                           R11.2, Nausea with vomiting, unspecified CPT copyright 2019 American Medical Association. All rights reserved. The codes documented in this report are preliminary and upon coder review may  be revised to meet current compliance requirements. Justice Britain, MD 12/26/2021 12:42:57 PM Number of Addenda: 0

## 2021-12-26 NOTE — Progress Notes (Signed)
Pt refusing stat CXR, denies SOB and pt doesn't think she needs it. MD Thedore Mins notified.

## 2021-12-26 NOTE — Plan of Care (Signed)
                                      [facility]                            [address] [place], [place] [address]      Natalie [name] was admitted to the Hospital on 12/24/2021 and Discharged  12/26/2021 and should be excused from work/school   for  3 days starting from date -  12/24/2021 , may return to work/school without any restrictions.  Call Susa Raring MD, [facility]  [phone] with questions.  Susa Raring M.D on 12/26/2021,at 2:17 PM  [facility]   Office  [phone]

## 2021-12-26 NOTE — Discharge Summary (Signed)
Natalie [name] CBJ:[id number] DOB: 10/03/1980 DOA: 12/24/2021  PCP: Orion Crook I, NP  Admit date: 12/24/2021  Discharge date: 12/26/2021  Admitted From: Home   Disposition:  Home   Recommendations for Outpatient Follow-up:   Follow up with PCP in 1-2 weeks  PCP Please obtain BMP/CBC, 2 view CXR in 1week,  (see Discharge instructions)   PCP Please follow up on the following pending results: Needs MRCP in 2 months and close outpatient GI follow-up   Home Health: None   Equipment/Devices: None  Consultations: GI Discharge Condition: Stable    CODE STATUS: Full    Diet Recommendation: Heart Healthy     Chief Complaint  Patient presents with   Abdominal Pain     Brief history of present illness from the day of admission and additional interim summary    41 y.o. female with medical history significant of alcoholic pancreatitis presenting with abdominal pain.  She reports no ETOH since last hospitalization.  Also no dietary changes.  She was in her usual state of health until about 9pm last night, when she developed acute LUQ pain.  She further developed n/v and has been unable to tolerate even sips of water in the ER.  No diarrhea.  No sick contacts.  She was admitted for further evaluation and work-up for ongoing left upper quadrant abdominal pain.                                                                 Hospital Course     Nonradiating left upper quadrant abdominal pain with nausea vomiting.  She has history of alcoholic pancreatitis but has had no alcohol in several mths, lipase was unremarkable, she had CT scan of the abdomen pelvis which was nonacute except for pancreatic pseudocyst went EGD showing mild gastritis.  Seen by GI.  Treated with PPI and supportive care with good improvement in her  symptoms.  Case discussed with GI team okay for discharge on PPI and Carafate with close outpatient GI and PCP follow-up.  Strictly counseled to abstain from alcohol.   2.  Ongoing history of smoking and marijuana use.  Counseled to quit, she has quit alcohol several mths ago and is not drinking any at all.  Requested to continue to abstain.  3.  Possible pancreatic pseudocyst on CT scan.  MRCP in 2 months with close outpatient GI follow-up.   Discharge diagnosis     Principal Problem:   Intractable vomiting with nausea Active Problems:   Marijuana dependence (HCC)   Nicotine dependence    Discharge instructions    Discharge Instructions     Diet - low sodium heart healthy   Complete by: As directed    Discharge instructions   Complete by: As directed    Follow with Primary MD  Orion Crook I, NP in 7 days, you need MRCP in 2 months.  Get CBC, CMP, 2 view Chest X ray -  checked next visit within 1 week by Primary MD    Activity: As tolerated with Full fall precautions use walker/cane & assistance as needed  Disposition Home    Diet: Heart Healthy    Special Instructions: If you have smoked or chewed Tobacco  in the last 2 yrs please stop smoking, stop any regular Alcohol  and or any Recreational drug use.  On your next visit with your primary care physician please Get Medicines reviewed and adjusted.  Please request your Prim.MD to go over all Hospital Tests and Procedure/Radiological results at the follow up, please get all Hospital records sent to your Prim MD by signing hospital release before you go home.  If you experience worsening of your admission symptoms, develop shortness of breath, life threatening emergency, suicidal or homicidal thoughts you must seek medical attention immediately by calling 911 or calling your MD immediately  if symptoms less severe.  You Must read complete instructions/literature along with all the possible adverse reactions/side effects  for all the Medicines you take and that have been prescribed to you. Take any new Medicines after you have completely understood and accpet all the possible adverse reactions/side effects.   Increase activity slowly   Complete by: As directed        Discharge Medications   Allergies as of 12/26/2021       Reactions   Fish Allergy Anaphylaxis, Other (See Comments)   Pt stated fish causes her to go into cardiac arrest   Flagyl [metronidazole]    Iodine    Latex    Oxycodone Itching   Pt stated it caused her to be confused    Shellfish Allergy Hives, Itching   Strawberry Flavor         Medication List     TAKE these medications    Acetaminophen Extra Strength 500 MG tablet Generic drug: acetaminophen Take 1 tablet (500 mg total) by mouth every 8 (eight) hours as needed for headache, mild pain or fever.   albuterol 108 (90 Base) MCG/ACT inhaler Commonly known as: VENTOLIN HFA Inhale 2 puffs into the lungs every 4 (four) hours as needed for wheezing or shortness of breath.   ALLERGY EYE OP Place 1 drop into both eyes daily as needed (red/itchy eyes).   ALLERGY MED PO Take 1 tablet by mouth daily as needed (allergies).   ondansetron 4 MG tablet Commonly known as: Zofran Take 1 tablet (4 mg total) by mouth every 8 (eight) hours as needed for nausea or vomiting.   pantoprazole 40 MG tablet Commonly known as: PROTONIX Take 1 tablet (40 mg total) by mouth daily. Start taking on: December 27, 2021   polyethylene glycol powder 17 GM/SCOOP powder Commonly known as: GLYCOLAX/MIRALAX Take 1 capful with water (17 g) by mouth daily.   sucralfate 1 GM/10ML suspension Commonly known as: CARAFATE Take 10 mLs (1 g total) by mouth 2 (two) times daily for 21 days.         Follow-up Information     Orion Crook I, NP. Schedule an appointment as soon as possible for a visit in 1 week(s).   Specialty: Nurse Practitioner Contact information: [address] [place]  [place] [address] [phone]         [facility]. Schedule an appointment as soon as possible for a visit in 2 week(s).  Specialty: Gastroenterology Why: You need MRCP in 2 months. Contact information: [address] [place] [address] [phone]                Major procedures and Radiology Reports - PLEASE review detailed and final reports thoroughly  -       US Abdomen Limited RUQ (LIVER/GB)  Result Date: 12/25/2021 CLINICAL DATA:  Right upper quadrant pain since Sunday EXAM: ULTRASOUND ABDOMEN LIMITED RIGHT UPPER QUADRANT COMPARISON:  CT abdomen and pelvis 12/25/2021 FINDINGS: Gallbladder: No gallstones or wall thickening visualized. No sonographic Murphy sign noted by sonographer. Common bile duct: Diameter: 2 mm, normal Liver: Normal echotexture. No focal lesions. Portal vein is patent on color Doppler imaging with normal direction of blood flow towards the liver. Other: None. IMPRESSION: No evidence of cholelithiasis or acute cholecystitis Electronically Signed   By: Burman Nieves M.D.   On: 12/25/2021 22:30   CT ABDOMEN PELVIS W CONTRAST  Result Date: 12/25/2021 CLINICAL DATA:  Abdominal pain, nausea, vomiting EXAM: CT ABDOMEN AND PELVIS WITH CONTRAST TECHNIQUE: Multidetector CT imaging of the abdomen and pelvis was performed using the standard protocol following bolus administration of intravenous contrast. RADIATION DOSE REDUCTION: This exam was performed according to the departmental dose-optimization program which includes automated exposure control, adjustment of the mA and/or kV according to patient size and/or use of iterative reconstruction technique. CONTRAST:  OMNIPAQUE IOHEXOL 300 MG/ML  SOLN COMPARISON:  10/20/2021 FINDINGS: Lower chest: No acute abnormality. Hepatobiliary: No solid liver abnormality is seen. No gallstones, gallbladder wall thickening, or biliary dilatation. Pancreas: No acute inflammatory findings. New 0.8 cm  fluid attenuation cystic lesion of the ventral pancreatic body (series 3, image 22). Spleen: Normal in size without significant abnormality. Adrenals/Urinary Tract: Adrenal glands are unremarkable. Kidneys are normal, without renal calculi, solid lesion, or hydronephrosis. Bladder is unremarkable. Stomach/Bowel: Stomach is within normal limits. Appendix appears normal. No evidence of bowel wall thickening, distention, or inflammatory changes. Vascular/Lymphatic: Scattered aortic atherosclerosis. No enlarged abdominal or pelvic lymph nodes. Reproductive: No mass or other significant abnormality. Other: No abdominal wall hernia or abnormality. Small volume nonspecific free fluid in the low pelvis (series 3, image 67). Musculoskeletal: No acute or significant osseous findings. Chronic bilateral pars defects of L5. IMPRESSION: 1. No acute CT findings of the abdomen or pelvis to explain abdominal pain, nausea or vomiting. 2. New 0.8 cm fluid attenuation cystic lesion of the ventral pancreatic body, consistent with a small pseudocyst following recent acute pancreatitis. As there is no observed increased risk of malignancy with such lesions smaller than 2 cm, no specific further follow-up or characterization is required. 3. Small volume nonspecific free fluid in the low pelvis, likely functional in the reproductive age setting. 4. Aortic atherosclerosis, advanced for patient age. Aortic Atherosclerosis (ICD10-I70.0). Electronically Signed   By: Jearld Lesch M.D.   On: 12/25/2021 13:31       Today   Subjective    Natalie [name] today has no headache,no chest abdominal pain,no new weakness tingling or numbness, feels much better wants to go home today.     Objective   Blood pressure 97/65, pulse (!) 51, temperature 97.8 F (36.6 C), temperature source Temporal, resp. rate 20, height 5' 9.5" (1.765 m), weight 68 kg, SpO2 99 %.   Intake/Output Summary (Last 24 hours) at 12/26/2021 1327 Last data filed at  12/26/2021 1244 Gross per 24 hour  Intake 1294.57 ml  Output --  Net 1294.57 ml    Exam  Awake Alert, No new F.N deficits,    [place].AT,PERRAL Supple Neck,   Symmetrical Chest wall movement, Good air movement bilaterally, CTAB RRR,No Gallops,   +ve B.Sounds, Abd Soft, Non tender,  No Cyanosis, Clubbing or edema    Data Review   Recent Labs  Lab 12/24/21 0726 12/25/21 0251 12/26/21 0708  WBC 8.4 8.9 15.7*  HGB 12.9 11.8* 11.0*  HCT 39.8 36.3 32.5*  PLT 278 233 237  MCV 96.8 95.0 93.1  MCH 31.4 30.9 31.5  MCHC 32.4 32.5 33.8  RDW 12.2 12.1 12.1    Recent Labs  Lab 12/24/21 0726 12/25/21 0251 12/26/21 0708  NA 137 137 137  K 4.1 4.0 3.5  CL 108 110 108  CO2 22 23 23   GLUCOSE 103* 96 103*  BUN 9 6 6   CREATININE 0.80 0.71 0.77  CALCIUM 9.4 9.0 9.2  AST 23 18 17   ALT 19 16 15   ALKPHOS 60 50 49  BILITOT 0.6 1.3* 1.0  ALBUMIN 4.1 3.5 3.5    Total Time in preparing paper work, data evaluation and todays exam - 35 minutes  M.D on 12/26/2021 at 1:27 PM  [facility]

## 2021-12-26 NOTE — Anesthesia Preprocedure Evaluation (Addendum)
Anesthesia Evaluation  Patient identified by MRN, date of birth, ID band Patient awake    Reviewed: Allergy & Precautions, NPO status , Patient's Chart, lab work & pertinent test results  History of Anesthesia Complications Negative for: history of anesthetic complications  Airway Mallampati: II  TM Distance: >3 FB Neck ROM: Full    Dental  (+) Missing,    Pulmonary asthma , Current Smoker,    Pulmonary exam normal        Cardiovascular negative cardio ROS Normal cardiovascular exam     Neuro/Psych negative neurological ROS  negative psych ROS   GI/Hepatic negative GI ROS, (+)     substance abuse  marijuana use, Hepatitis -  Endo/Other  negative endocrine ROS  Renal/GU negative Renal ROS  negative genitourinary   Musculoskeletal negative musculoskeletal ROS (+)   Abdominal   Peds  Hematology negative hematology ROS (+)   Anesthesia Other Findings Day of surgery medications reviewed with patient.  Reproductive/Obstetrics negative OB ROS                            Anesthesia Physical Anesthesia Plan  ASA: 3  Anesthesia Plan: MAC   Post-op Pain Management: Minimal or no pain anticipated   Induction:   PONV Risk Score and Plan: 1 and Treatment may vary due to age or medical condition and Propofol infusion  Airway Management Planned: Natural Airway and Nasal Cannula  Additional Equipment: None  Intra-op Plan:   Post-operative Plan:   Informed Consent: I have reviewed the patients History and Physical, chart, labs and discussed the procedure including the risks, benefits and alternatives for the proposed anesthesia with the patient or authorized representative who has indicated his/her understanding and acceptance.       Plan Discussed with: CRNA  Anesthesia Plan Comments:        Anesthesia Quick Evaluation

## 2021-12-26 NOTE — Anesthesia Postprocedure Evaluation (Signed)
Anesthesia Post Note  Patient: Natalie [name]  Procedure(s) Performed: ESOPHAGOGASTRODUODENOSCOPY (EGD) WITH PROPOFOL BIOPSY     Patient location during evaluation: PACU Anesthesia Type: MAC Level of consciousness: awake and alert Pain management: pain level controlled Vital Signs Assessment: post-procedure vital signs reviewed and stable Respiratory status: spontaneous breathing, nonlabored ventilation and respiratory function stable Cardiovascular status: blood pressure returned to baseline Postop Assessment: no apparent nausea or vomiting Anesthetic complications: no   No notable events documented.  Last Vitals:  Vitals:   12/26/21 1251 12/26/21 1300  BP: 96/66 97/65  Pulse: (!) 55 (!) 51  Resp: (!) 21 20  Temp:    SpO2: 98% 99%    Last Pain:  Vitals:   12/26/21 1300  TempSrc:   PainSc: 0-No pain                 Marthenia Rolling

## 2021-12-26 NOTE — Discharge Instructions (Addendum)
Follow with Primary MD Orion Crook I, NP in 7 days, you need MRCP in 2 months.  Get CBC, CMP, 2 view Chest X ray -  checked next visit within 1 week by Primary MD    Activity: As tolerated with Full fall precautions use walker/cane & assistance as needed  Disposition Home    Diet: Heart Healthy    Special Instructions: If you have smoked or chewed Tobacco  in the last 2 yrs please stop smoking, stop any regular Alcohol  and or any Recreational drug use.  On your next visit with your primary care physician please Get Medicines reviewed and adjusted.  Please request your Prim.MD to go over all Hospital Tests and Procedure/Radiological results at the follow up, please get all Hospital records sent to your Prim MD by signing hospital release before you go home.  If you experience worsening of your admission symptoms, develop shortness of breath, life threatening emergency, suicidal or homicidal thoughts you must seek medical attention immediately by calling 911 or calling your MD immediately  if symptoms less severe.  You Must read complete instructions/literature along with all the possible adverse reactions/side effects for all the Medicines you take and that have been prescribed to you. Take any new Medicines after you have completely understood and accpet all the possible adverse reactions/side effects.

## 2021-12-26 NOTE — Anesthesia Procedure Notes (Signed)
Procedure Name: MAC Date/Time: 12/26/2021 12:07 PM  Performed by: Kyung Rudd, CRNAPre-anesthesia Checklist: Patient identified, Emergency Drugs available, Suction available and Patient being monitored Patient Re-evaluated:Patient Re-evaluated prior to induction Oxygen Delivery Method: Nasal cannula Induction Type: IV induction Placement Confirmation: positive ETCO2 Dental Injury: Teeth and Oropharynx as per pre-operative assessment

## 2021-12-27 ENCOUNTER — Encounter: Payer: Self-pay | Admitting: Gastroenterology

## 2021-12-27 LAB — SURGICAL PATHOLOGY

## 2021-12-28 ENCOUNTER — Encounter (HOSPITAL_COMMUNITY): Payer: Self-pay | Admitting: Gastroenterology

## 2022-01-09 ENCOUNTER — Other Ambulatory Visit: Payer: Self-pay

## 2022-01-11 ENCOUNTER — Other Ambulatory Visit (HOSPITAL_COMMUNITY): Payer: Self-pay

## 2022-01-22 ENCOUNTER — Other Ambulatory Visit (HOSPITAL_COMMUNITY): Payer: Self-pay

## 2022-01-22 ENCOUNTER — Ambulatory Visit (INDEPENDENT_AMBULATORY_CARE_PROVIDER_SITE_OTHER): Payer: PRIVATE HEALTH INSURANCE

## 2022-01-22 ENCOUNTER — Ambulatory Visit
Admission: EM | Admit: 2022-01-22 | Discharge: 2022-01-22 | Disposition: A | Discharge status: Home / Self Care | Payer: Self-pay | Attending: Physician Assistant | Admitting: Physician Assistant

## 2022-01-22 ENCOUNTER — Other Ambulatory Visit: Payer: Self-pay

## 2022-01-22 ENCOUNTER — Encounter: Payer: Self-pay | Admitting: Emergency Medicine

## 2022-01-22 DIAGNOSIS — R1013 Epigastric pain: Secondary | ICD-10-CM

## 2022-01-22 DIAGNOSIS — R1110 Vomiting, unspecified: Secondary | ICD-10-CM

## 2022-01-22 DIAGNOSIS — R109 Unspecified abdominal pain: Secondary | ICD-10-CM

## 2022-01-22 MED ORDER — SODIUM CHLORIDE 0.9 % IV BOLUS
1000.0000 mL | Freq: Once | INTRAVENOUS | Status: AC
  Administered 2022-01-22: 1000 mL via INTRAVENOUS

## 2022-01-22 MED ORDER — SUCRALFATE 1 G PO TABS
1.0000 g | ORAL_TABLET | Freq: Two times a day (BID) | ORAL | 0 refills | Status: DC
  Filled 2022-01-22 – 2022-01-24 (×3): qty 42, 21d supply, fill #0

## 2022-01-22 MED ORDER — PANTOPRAZOLE SODIUM 40 MG PO TBEC
40.0000 mg | DELAYED_RELEASE_TABLET | Freq: Every day | ORAL | 0 refills | Status: DC
  Filled 2022-01-22 – 2022-01-24 (×3): qty 30, 30d supply, fill #0

## 2022-01-22 MED ORDER — ONDANSETRON HCL 4 MG/2ML IJ SOLN: 4.0000 mg | Freq: Once | INTRAMUSCULAR | Status: DC

## 2022-01-22 MED ORDER — ONDANSETRON HCL 4 MG/2ML IJ SOLN
4.0000 mg | Freq: Once | INTRAMUSCULAR | Status: AC
  Administered 2022-01-22: 4 mg via INTRAVENOUS

## 2022-01-22 MED ORDER — ONDANSETRON 4 MG PO TBDP: 4.0000 mg | ORAL_TABLET | Freq: Once | ORAL | Status: DC

## 2022-01-22 NOTE — ED Triage Notes (Signed)
Pt here for abd pain and vomiting starting this am; pt sts hx of pancreatitis

## 2022-01-22 NOTE — ED Provider Notes (Addendum)
[facility]    CSN: [id number] Arrival date & time: 01/22/22  3536      History   Chief Complaint Chief Complaint  Patient presents with   Vomiting    HPI Natalie [name] is a 41 y.o. female.   Patient here today for evaluation of epigastric pain and left upper quadrant pain that started this morning.  She reports that she has had history of both pancreatitis and gastritis in the past and this feels more like gastritis as pain seems to be moving into her lower abdomen.  She has had some vomiting this morning but denies any diarrhea.  She does report constipation with last bowel movement 2 days ago.  She has been able to pass gas. She has not had fever.  She denies any blood in her vomit or stool.  She does not report treatment for symptoms.  The history is provided by the patient.    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   Alcohol abuse 10/20/2021   Alcoholic hepatitis 10/20/2021   Fatty liver 10/20/2021    Past Surgical History:  Procedure Laterality Date   BIOPSY  12/26/2021   Procedure: BIOPSY;  Surgeon: Lemar Lofty., MD;  Location: [facility] ENDOSCOPY;  Service: Gastroenterology;;   ESOPHAGOGASTRODUODENOSCOPY (EGD) WITH PROPOFOL N/A 12/26/2021   Procedure: ESOPHAGOGASTRODUODENOSCOPY (EGD) WITH PROPOFOL;  Surgeon: Lemar Lofty., MD;  Location: [facility] ENDOSCOPY;  Service: Gastroenterology;  Laterality: N/A;   partial hysterectomy     TUBAL LIGATION      OB History   No obstetric history on file.      Home Medications    Prior to Admission medications   Medication Sig Start Date End Date Taking? Authorizing Provider  acetaminophen (TYLENOL) 500 MG tablet Take 1 tablet  (500 mg total) by mouth every 8 (eight) hours as needed for headache, mild pain or fever. 10/24/21   Rolly Salter, MD  albuterol (VENTOLIN HFA) 108 (90 Base) MCG/ACT inhaler Inhale 2 puffs into the lungs every 4 (four) hours as needed for wheezing or shortness of breath.    [provider]  diphenhydrAMINE HCl (ALLERGY MED PO) Take 1 tablet by mouth daily as needed (allergies).    [provider]  Naphazoline-Pheniramine (ALLERGY EYE OP) Place 1 drop into both eyes daily as needed (red/itchy eyes).    [provider]  ondansetron (ZOFRAN) 4 MG tablet Take 1 tablet (4 mg total) by mouth every 8 (eight) hours as needed for nausea or vomiting. 12/26/21   Leroy Sea, MD  pantoprazole (PROTONIX) 40 MG tablet Take 1 tablet (40 mg total) by mouth daily. 01/22/22   Tomi Bamberger, PA-C  polyethylene glycol powder (GLYCOLAX/MIRALAX) 17 GM/SCOOP powder Take 1 capful with water (17 g) by mouth daily. 10/25/21   Rolly Salter, MD  sucralfate (CARAFATE) 1 g tablet Take 1 tablet (1 g total) by mouth 2 (two) times daily for 21 days. 01/22/22 02/12/22  Tomi Bamberger, PA-C    Family History Family History  Problem Relation Age of Onset   Heart murmur Mother    Arthritis Mother    Arthritis Father    Heart murmur Father  Depression Maternal Aunt    Cancer Paternal Grandfather     Social History Social History   Tobacco Use   Smoking status: Every Day    Packs/day: 0.50    Types: Cigarettes   Smokeless tobacco: Never  Vaping Use   Vaping Use: Never used  Substance Use Topics   Alcohol use: Not Currently    Comment: quit two months ago per 12/24/21   Drug use: Yes    Types: Marijuana    Comment: daily     Allergies   Fish allergy, Flagyl [metronidazole], Iodine, Latex, Oxycodone, Shellfish allergy, and Strawberry flavor   Review of Systems Review of Systems  Constitutional:  Negative for chills and fever.  Eyes:  Negative for discharge and redness.   Respiratory:  Negative for shortness of breath.   Gastrointestinal:  Positive for abdominal pain, constipation, nausea and vomiting. Negative for diarrhea.     Physical Exam Triage Vital Signs ED Triage Vitals [01/22/22 1032]  Enc Vitals Group     BP (!) 149/78     Pulse Rate 71     Resp 18     Temp 98.1 F (36.7 C)     Temp Source Oral     SpO2 98 %     Weight      Height      Head Circumference      Peak Flow      Pain Score 7     Pain Loc      Pain Edu?      Excl. in GC?    No data found.  Updated Vital Signs BP (!) 149/78 (BP Location: Left Arm)   Pulse 71   Temp 98.1 F (36.7 C) (Oral)   Resp 18   SpO2 98%      Physical Exam Vitals and nursing note reviewed.  Constitutional:      General: She is not in acute distress.    Appearance: Normal appearance. She is not ill-appearing.  HENT:     Head: Normocephalic and atraumatic.     Nose: No congestion.  Eyes:     Conjunctiva/sclera: Conjunctivae normal.  Cardiovascular:     Rate and Rhythm: Normal rate and regular rhythm.     Heart sounds: Normal heart sounds. No murmur heard. Pulmonary:     Effort: Pulmonary effort is normal. No respiratory distress.     Breath sounds: Normal breath sounds. No wheezing, rhonchi or rales.  Abdominal:     General: Abdomen is flat. Bowel sounds are normal. There is no distension.     Palpations: Abdomen is soft.     Tenderness: There is abdominal tenderness (mild epigastric and central lower abdominal TTP). There is no guarding or rebound.  Neurological:     Mental Status: She is alert.  Psychiatric:        Mood and Affect: Mood normal.        Behavior: Behavior normal.      UC Treatments / Results  Labs (all labs ordered are listed, but only abnormal results are displayed) Labs Reviewed  CBC WITH DIFFERENTIAL/PLATELET  COMPREHENSIVE METABOLIC PANEL  AMYLASE  LIPASE  H. PYLORI ANTIBODY, IGG    EKG   Radiology DG Abd 2 Views  Result Date:  01/22/2022 CLINICAL DATA:  Abdominal pain and vomiting EXAM: ABDOMEN - 2 VIEW COMPARISON:  CT abdomen/pelvis 12/25/2021 FINDINGS: There is a nonobstructive bowel gas pattern. There is no abnormally large colonic stool burden. There is no free intraperitoneal air. There  is no abnormal soft tissue calcification. There is no acute osseous abnormality. The imaged lung bases are clear. IMPRESSION: Normal KUB. Electronically Signed   By: Lesia Hausen M.D.   On: 01/22/2022 11:18    Procedures Procedures (including critical care time)  Medications Ordered in UC Medications - No data to display  Initial Impression / Assessment and Plan / UC Course  I have reviewed the triage vital signs and the nursing notes.  Pertinent labs & imaging results that were available during my care of the patient were reviewed by me and considered in my medical decision making (see chart for details).    Xray ordered to rule out possible partial obstruction- non concerning gas pattern noted. Labs ordered but strongly recommended evaluation in the ED with any worsening symptoms as our labs may take 48 hours or longer to result. Will refill Carafate and pantoprazole given history.   Ondansetron and fluids administered in office today.     Final Clinical Impressions(s) / UC Diagnoses   Final diagnoses:  Abdominal pain, epigastric   Discharge Instructions   None    ED Prescriptions     Medication Sig Dispense Auth. Provider   sucralfate (CARAFATE) 1 g tablet Take 1 tablet (1 g total) by mouth 2 (two) times daily for 21 days. 42 tablet Erma Pinto F, PA-C   pantoprazole (PROTONIX) 40 MG tablet Take 1 tablet (40 mg total) by mouth daily. 30 tablet Tomi Bamberger, PA-C      PDMP not reviewed this encounter.   Tomi Bamberger, PA-C 01/22/22 1148    Tomi Bamberger, PA-C 01/22/22 1158

## 2022-01-23 LAB — CBC WITH DIFFERENTIAL/PLATELET
Basophils Absolute: 0.1 10*3/uL (ref 0.0–0.2)
Basos: 1 %
EOS (ABSOLUTE): 0.4 10*3/uL (ref 0.0–0.4)
Eos: 4 %
Hematocrit: 41.8 % (ref 34.0–46.6)
Hemoglobin: 14 g/dL (ref 11.1–15.9)
Immature Grans (Abs): 0 10*3/uL (ref 0.0–0.1)
Immature Granulocytes: 0 %
Lymphocytes Absolute: 2.1 10*3/uL (ref 0.7–3.1)
Lymphs: 20 %
MCH: 30.6 pg (ref 26.6–33.0)
MCHC: 33.5 g/dL (ref 31.5–35.7)
MCV: 91 fL (ref 79–97)
Monocytes Absolute: 0.4 10*3/uL (ref 0.1–0.9)
Monocytes: 4 %
Neutrophils Absolute: 7.5 10*3/uL — ABNORMAL HIGH (ref 1.4–7.0)
Neutrophils: 71 %
Platelets: 329 10*3/uL (ref 150–450)
RBC: 4.58 x10E6/uL (ref 3.77–5.28)
RDW: 11.9 % (ref 11.7–15.4)
WBC: 10.5 10*3/uL (ref 3.4–10.8)

## 2022-01-23 LAB — H. PYLORI ANTIBODY, IGG: H. pylori, IgG AbS: 0.71 Index Value (ref 0.00–0.79)

## 2022-01-23 LAB — COMPREHENSIVE METABOLIC PANEL
ALT: 20 IU/L (ref 0–32)
AST: 22 IU/L (ref 0–40)
Albumin/Globulin Ratio: 1.7 (ref 1.2–2.2)
Albumin: 5.3 g/dL — ABNORMAL HIGH (ref 3.9–4.9)
Alkaline Phosphatase: 109 IU/L (ref 44–121)
BUN/Creatinine Ratio: 11 (ref 9–23)
BUN: 9 mg/dL (ref 6–24)
Bilirubin Total: 1.2 mg/dL (ref 0.0–1.2)
CO2: 19 mmol/L — ABNORMAL LOW (ref 20–29)
Calcium: 10.2 mg/dL (ref 8.7–10.2)
Chloride: 98 mmol/L (ref 96–106)
Creatinine, Ser: 0.81 mg/dL (ref 0.57–1.00)
Globulin, Total: 3.2 g/dL (ref 1.5–4.5)
Glucose: 90 mg/dL (ref 70–99)
Potassium: 4 mmol/L (ref 3.5–5.2)
Sodium: 138 mmol/L (ref 134–144)
Total Protein: 8.5 g/dL (ref 6.0–8.5)
eGFR: 93 mL/min/{1.73_m2} (ref >59)

## 2022-01-23 LAB — LIPASE: Lipase: 62 U/L (ref 14–72)

## 2022-01-23 LAB — AMYLASE: Amylase: 218 U/L — ABNORMAL HIGH (ref 31–110)

## 2022-01-24 ENCOUNTER — Other Ambulatory Visit (HOSPITAL_COMMUNITY): Payer: Self-pay

## 2022-02-04 ENCOUNTER — Ambulatory Visit: Payer: Self-pay | Admitting: Nurse Practitioner

## 2022-07-28 IMAGING — CT CT ABD-PELV W/O CM
2 of 4 series · 16 of 46 positions shown, 18 images · non-contrast
Comparison: None Available.

CLINICAL DATA: Epigastric pain severe pain likely pancreatitis
concern for complication (hx of iodine allergy)



[Series 3: abd/ pelvis 5.0 i30f 2 · axial · 0.77mm/px · z∈[+874,+1284]mm · 13 of 90 slices shown, 15 images]
[im 4/90  soft-tissue]
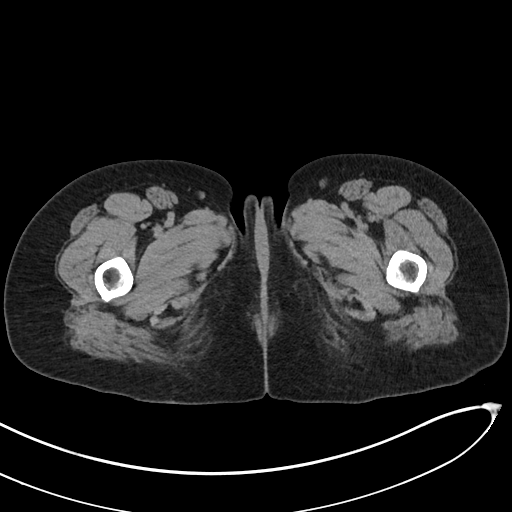
[im 4/90  bone]
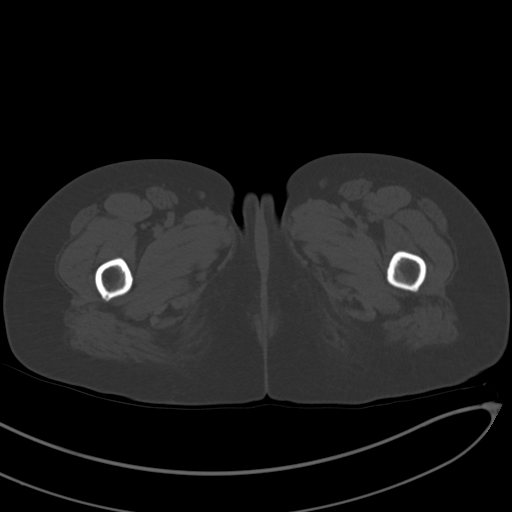
[im 11/90  soft-tissue]
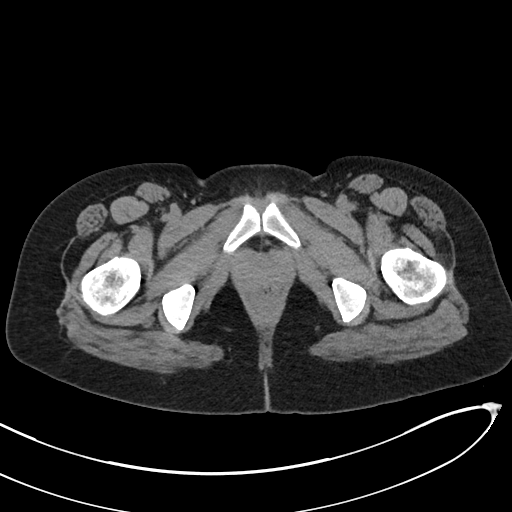
[im 18/90  soft-tissue]
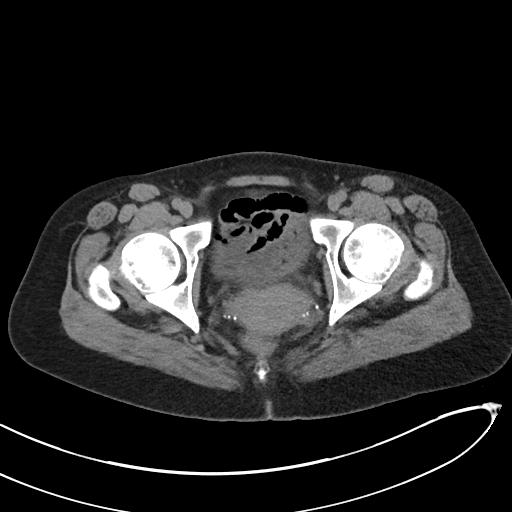
[im 25/90  soft-tissue]
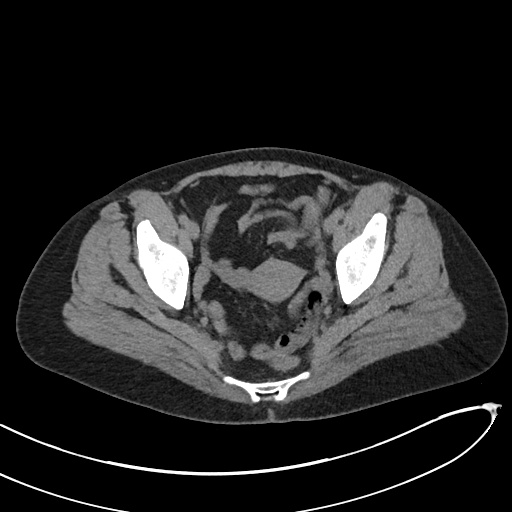
[im 33/90  soft-tissue]
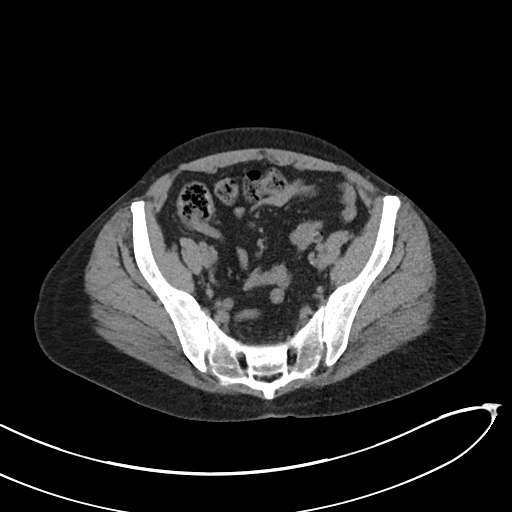
[im 40/90  soft-tissue]
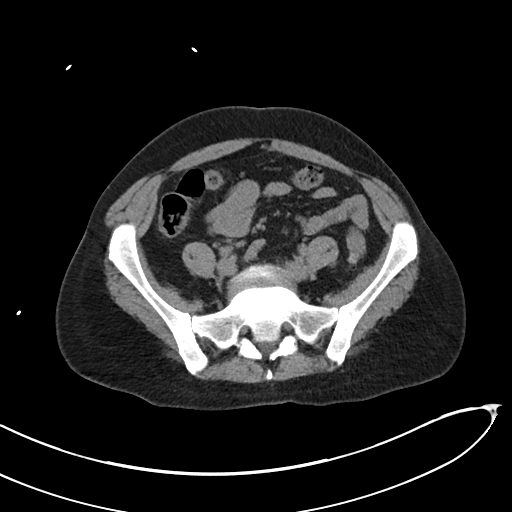
[im 47/90  soft-tissue]
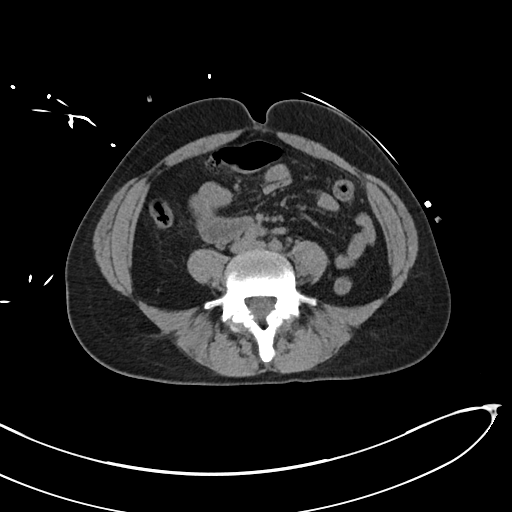
[im 50/90  soft-tissue]
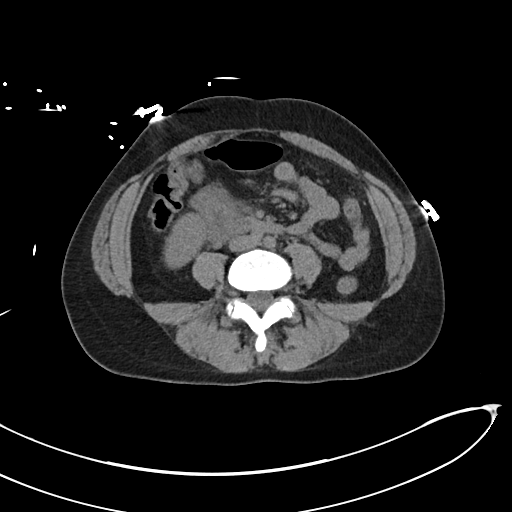
[im 57/90  soft-tissue]
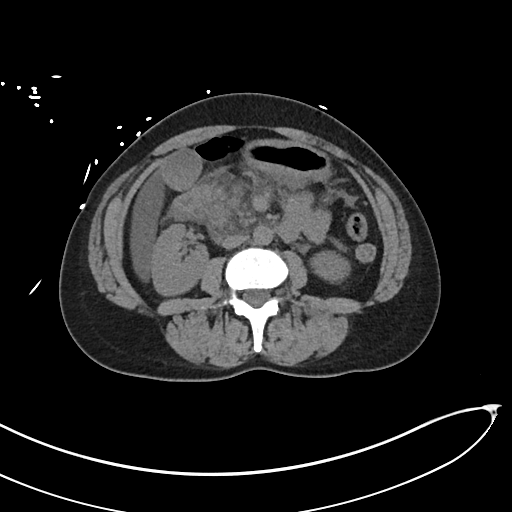
[im 57/90  bone]
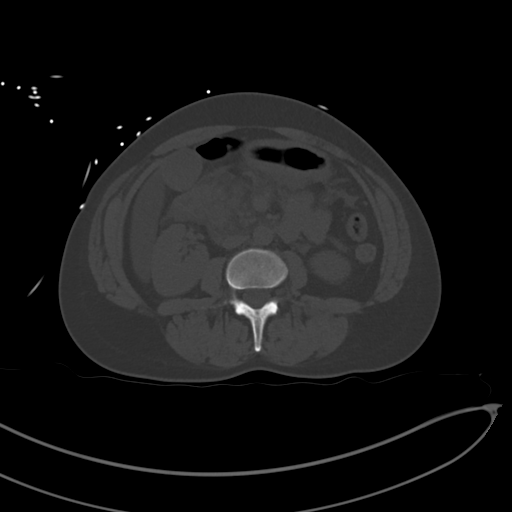
[im 65/90  soft-tissue]
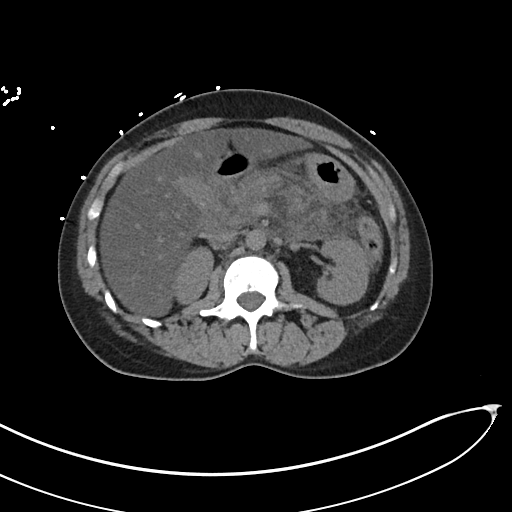
[im 72/90  soft-tissue]
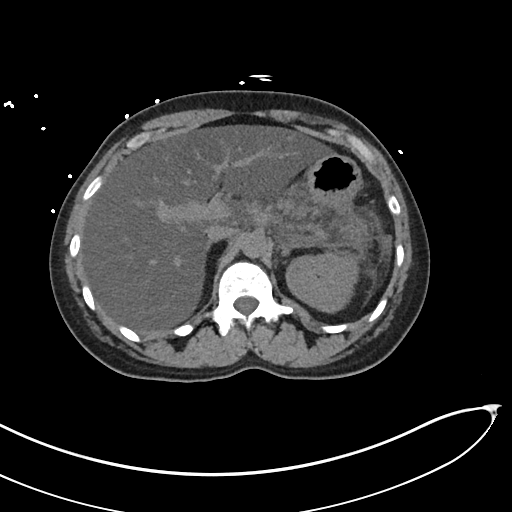
[im 79/90  soft-tissue]
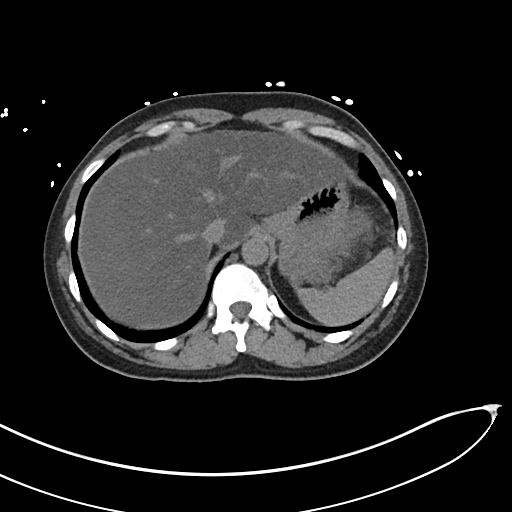
[im 86/90  soft-tissue]
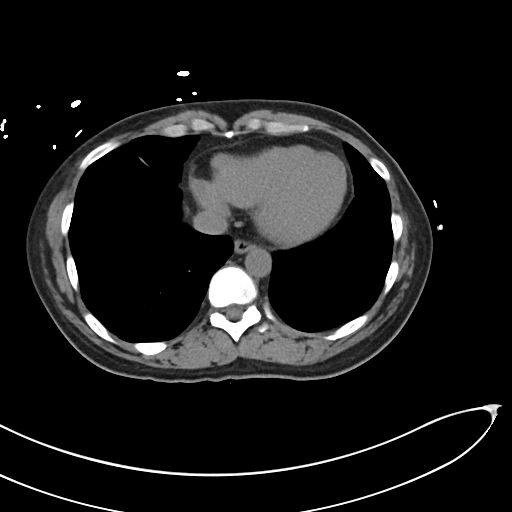

[Series 6: cor st · coronal · 0.75mm/px · 3 of 86 slices shown]
[im 29/86  soft-tissue]
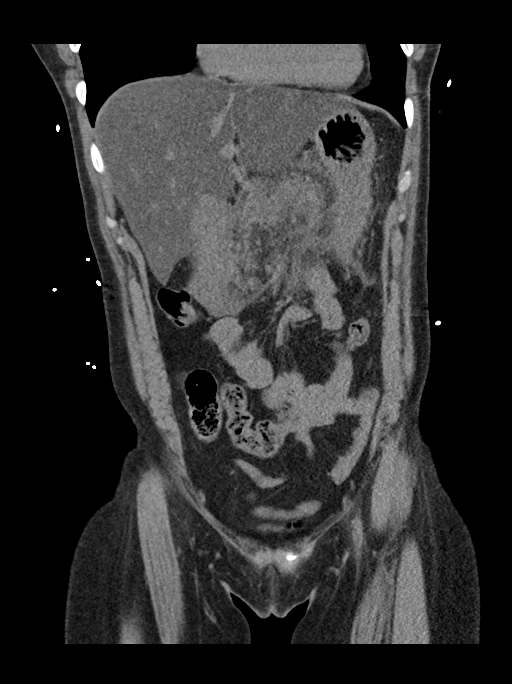
[im 38/86  soft-tissue]
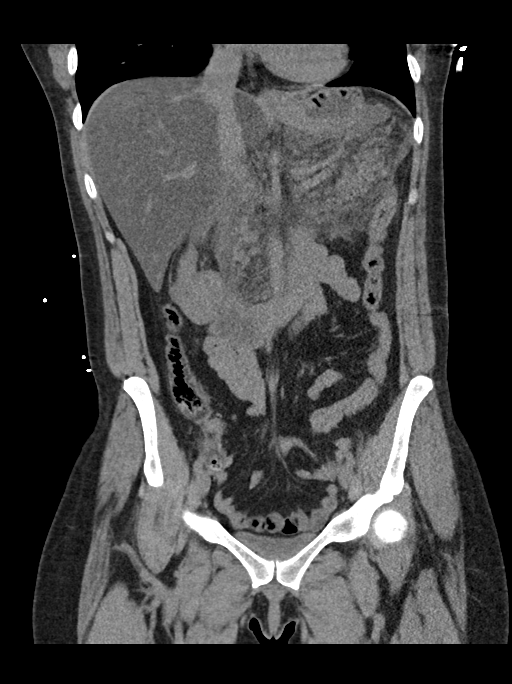
[im 48/86  soft-tissue]
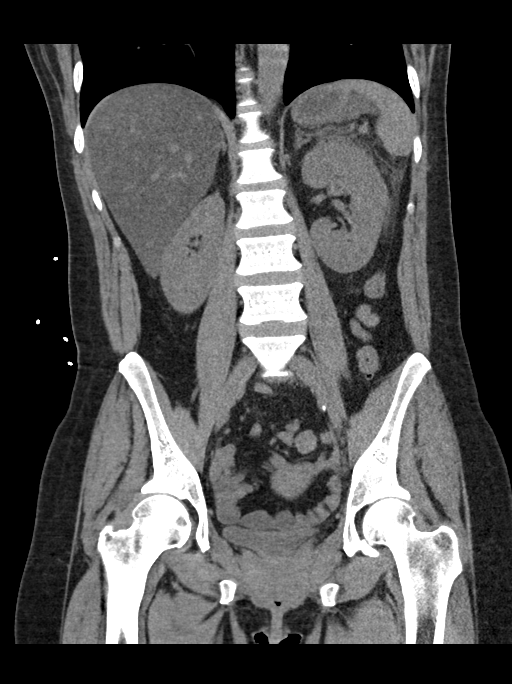

[16 of 46 positions shown; findings below may reference images not displayed]

FINDINGS: Lower chest: No acute abnormality

Hepatobiliary: Severe diffuse fatty infiltration of the liver. No
visible focal hepatic mass. Gallbladder grossly unremarkable.

Pancreas: Extensive or edema/inflammation around the pancreas
compatible with acute pancreatitis. No visible ductal dilatation.

Spleen: No focal abnormality.  Normal size.

Adrenals/Urinary Tract: No adrenal abnormality. No focal renal
abnormality. No stones or hydronephrosis. Urinary bladder is
unremarkable.

Stomach/Bowel: Stomach, large and small bowel grossly unremarkable.

Vascular/Lymphatic: No evidence of aneurysm or adenopathy.

Reproductive: Uterus and adnexa unremarkable.  No mass.

Other: No free fluid or free air.

Musculoskeletal: No acute bony abnormality.
IMPRESSION: Changes of acute pancreatitis with edematous pancreas and
surrounding inflammation/edema.

Severe diffuse fatty infiltration of the liver.

## 2023-01-21 ENCOUNTER — Other Ambulatory Visit: Payer: Self-pay

## 2023-01-21 ENCOUNTER — Emergency Department (HOSPITAL_COMMUNITY): Payer: Self-pay

## 2023-01-21 ENCOUNTER — Emergency Department (HOSPITAL_COMMUNITY)
Admission: EM | Admit: 2023-01-21 | Discharge: 2023-01-22 | Disposition: A | Discharge status: Home / Self Care | Payer: Self-pay | Attending: Student | Admitting: Student

## 2023-01-21 ENCOUNTER — Encounter (HOSPITAL_COMMUNITY): Payer: Self-pay

## 2023-01-21 DIAGNOSIS — J45909 Unspecified asthma, uncomplicated: Secondary | ICD-10-CM | POA: Insufficient documentation

## 2023-01-21 DIAGNOSIS — Z91040 Latex allergy status: Secondary | ICD-10-CM | POA: Diagnosis not present

## 2023-01-21 DIAGNOSIS — M542 Cervicalgia: Secondary | ICD-10-CM | POA: Insufficient documentation

## 2023-01-21 DIAGNOSIS — M545 Low back pain, unspecified: Secondary | ICD-10-CM | POA: Diagnosis not present

## 2023-01-21 DIAGNOSIS — Y92410 Unspecified street and highway as the place of occurrence of the external cause: Secondary | ICD-10-CM | POA: Insufficient documentation

## 2023-01-21 DIAGNOSIS — F17210 Nicotine dependence, cigarettes, uncomplicated: Secondary | ICD-10-CM | POA: Diagnosis not present

## 2023-01-21 DIAGNOSIS — M546 Pain in thoracic spine: Secondary | ICD-10-CM | POA: Diagnosis not present

## 2023-01-21 DIAGNOSIS — R0789 Other chest pain: Secondary | ICD-10-CM | POA: Insufficient documentation

## 2023-01-21 MED ORDER — KETOROLAC TROMETHAMINE 15 MG/ML IJ SOLN
15.0000 mg | Freq: Once | INTRAMUSCULAR | Status: AC
  Administered 2023-01-21: 15 mg via INTRAMUSCULAR
  Filled 2023-01-21: qty 1

## 2023-01-21 MED ORDER — HYDROCODONE-ACETAMINOPHEN 5-325 MG PO TABS
1.0000 | ORAL_TABLET | Freq: Once | ORAL | Status: AC
  Administered 2023-01-21: 1 via ORAL
  Filled 2023-01-21: qty 1
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
## 2023-01-22 ENCOUNTER — Other Ambulatory Visit (HOSPITAL_COMMUNITY): Payer: Self-pay

## 2023-01-22 MED ORDER — IBUPROFEN 800 MG PO TABS
800.0000 mg | ORAL_TABLET | Freq: Three times a day (TID) | ORAL | 0 refills | Status: DC | PRN
  Filled 2023-01-22: qty 21, 7d supply, fill #0

## 2023-01-22 MED ORDER — METHOCARBAMOL 500 MG PO TABS
500.0000 mg | ORAL_TABLET | Freq: Three times a day (TID) | ORAL | 0 refills | Status: AC | PRN
  Filled 2023-01-22: qty 20, 7d supply, fill #0

## 2023-01-22 MED ORDER — IBUPROFEN 800 MG PO TABS
800.0000 mg | ORAL_TABLET | Freq: Once | ORAL | Status: AC
  Administered 2023-01-22: 800 mg via ORAL
  Filled 2023-01-22: qty 1

## 2023-01-22 MED ORDER — METHOCARBAMOL 500 MG PO TABS
500.0000 mg | ORAL_TABLET | Freq: Once | ORAL | Status: AC
  Administered 2023-01-22: 500 mg via ORAL
  Filled 2023-01-22: qty 1
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
## 2023-01-22 NOTE — ED Provider Notes (Signed)
Care assumed from Dr. Posey Rea.  Traumatic imaging is reassuring.  Discussed with radiology Dr. Gwenyth Bender.   Tolerating p.o. and ambulatory   Glynn Octave, MD 01/22/23 [phone]

## 2023-01-24 ENCOUNTER — Other Ambulatory Visit (HOSPITAL_COMMUNITY): Payer: Self-pay

## 2023-02-26 ENCOUNTER — Other Ambulatory Visit: Payer: Self-pay

## 2023-02-26 ENCOUNTER — Other Ambulatory Visit (HOSPITAL_COMMUNITY): Payer: Self-pay

## 2023-02-26 ENCOUNTER — Emergency Department (HOSPITAL_COMMUNITY)
Admission: EM | Admit: 2023-02-26 | Discharge: 2023-02-26 | Disposition: A | Discharge status: Home / Self Care | Payer: Self-pay | Attending: Emergency Medicine | Admitting: Emergency Medicine

## 2023-02-26 ENCOUNTER — Emergency Department (HOSPITAL_COMMUNITY): Payer: Self-pay

## 2023-02-26 DIAGNOSIS — F17200 Nicotine dependence, unspecified, uncomplicated: Secondary | ICD-10-CM | POA: Insufficient documentation

## 2023-02-26 DIAGNOSIS — Z91040 Latex allergy status: Secondary | ICD-10-CM | POA: Insufficient documentation

## 2023-02-26 DIAGNOSIS — Z7951 Long term (current) use of inhaled steroids: Secondary | ICD-10-CM | POA: Diagnosis not present

## 2023-02-26 DIAGNOSIS — R079 Chest pain, unspecified: Secondary | ICD-10-CM

## 2023-02-26 DIAGNOSIS — J45909 Unspecified asthma, uncomplicated: Secondary | ICD-10-CM | POA: Diagnosis not present

## 2023-02-26 DIAGNOSIS — R0789 Other chest pain: Secondary | ICD-10-CM | POA: Insufficient documentation

## 2023-02-26 LAB — CBC
HCT: 42.3 % (ref 36.0–46.0)
Hemoglobin: 13.8 g/dL (ref 12.0–15.0)
MCH: 31.4 pg (ref 26.0–34.0)
MCHC: 32.6 g/dL (ref 30.0–36.0)
MCV: 96.4 fL (ref 80.0–100.0)
Platelets: 271 10*3/uL (ref 150–400)
RBC: 4.39 MIL/uL (ref 3.87–5.11)
RDW: 13.4 % (ref 11.5–15.5)
WBC: 13.6 10*3/uL — ABNORMAL HIGH (ref 4.0–10.5)
nRBC: 0 % (ref 0.0–0.2)

## 2023-02-26 LAB — BASIC METABOLIC PANEL
Anion gap: 14 (ref 5–15)
BUN: 8 mg/dL (ref 6–20)
CO2: 19 mmol/L — ABNORMAL LOW (ref 22–32)
Calcium: 9.3 mg/dL (ref 8.9–10.3)
Chloride: 103 mmol/L (ref 98–111)
Creatinine, Ser: 0.75 mg/dL (ref 0.44–1.00)
GFR, Estimated: >60 mL/min (ref >60)
Glucose, Bld: 108 mg/dL — ABNORMAL HIGH (ref 70–99)
Potassium: 3.5 mmol/L (ref 3.5–5.1)
Sodium: 136 mmol/L (ref 135–145)

## 2023-02-26 LAB — TROPONIN I (HIGH SENSITIVITY)
Troponin I (High Sensitivity): <2 ng/L (ref <18)
Troponin I (High Sensitivity): <2 ng/L (ref <18)

## 2023-02-26 LAB — HCG, SERUM, QUALITATIVE: Preg, Serum: NEGATIVE

## 2023-02-26 MED ORDER — IBUPROFEN 600 MG PO TABS
600.0000 mg | ORAL_TABLET | Freq: Four times a day (QID) | ORAL | 0 refills | Status: AC | PRN
Start: 2023-02-26 — End: ?
  Filled 2023-02-26: qty 30, 8d supply, fill #0

## 2023-02-26 MED ORDER — CYCLOBENZAPRINE HCL 10 MG PO TABS
10.0000 mg | ORAL_TABLET | Freq: Two times a day (BID) | ORAL | 0 refills | Status: AC | PRN
Start: 2023-02-26 — End: ?
  Filled 2023-02-26: qty 20, 10d supply, fill #0

## 2023-02-26 MED ORDER — KETOROLAC TROMETHAMINE 15 MG/ML IJ SOLN
15.0000 mg | Freq: Once | INTRAMUSCULAR | Status: AC
  Administered 2023-02-26: 15 mg via INTRAVENOUS
  Filled 2023-02-26: qty 1

## 2023-02-26 NOTE — ED Provider Notes (Signed)
[place] EMERGENCY DEPARTMENT AT [facility] Provider Note   CSN: [id number] Arrival date & time: 02/26/23  0559     History  Chief Complaint  Patient presents with   Chest Pain    Natalie [name] is a 42 y.o. female.  The history is provided by the patient and medical records. No language interpreter was used.  Chest Pain    42 year old female significant history of asthma, heart murmur, alcohol abuse and polysubstance use presenting with complaints of chest pain.  Patient report for the past 24 hours she has been noticing pain to the chest.  She described pain as a sharp aching sensation in her chest wraps around to her back and worse with movements.  Pain is moderate in severity.  She did endorse some shortness of breath and some wheezing initially and she used her inhaler thinking that it was an asthma attack.  She mention it did not provide much relief.  She does not endorse any fever or chills no runny nose sneezing or coughing no nausea vomiting or diarrhea no abdominal pain no lightheadedness or dizziness or diaphoresis.  She admits to alcohol and tobacco use but denies any cocaine use.  She did recall playing [place] [employer] several days prior and did quite a few swings and unsure if it contribute to her symptoms.  Home Medications Prior to Admission medications   Medication Sig Start Date End Date Taking? Authorizing Provider  acetaminophen (TYLENOL) 500 MG tablet Take 1 tablet (500 mg total) by mouth every 8 (eight) hours as needed for headache, mild pain or fever. 10/24/21   Rolly Salter, MD  albuterol (VENTOLIN HFA) 108 (90 Base) MCG/ACT inhaler Inhale 2 puffs into the lungs every 4 (four) hours as needed for wheezing or shortness of breath.    [provider]  diphenhydrAMINE HCl (ALLERGY MED PO) Take 1 tablet by mouth daily as needed (allergies).    [provider]  ibuprofen (ADVIL) 800 MG tablet Take 1 tablet (800 mg total) by mouth every 8  (eight) hours as needed. 01/22/23   Rancour, Jeannett Senior, MD  methocarbamol (ROBAXIN) 500 MG tablet Take 1 tablet (500 mg total) by mouth every 8 (eight) hours as needed for muscle spasms. 01/22/23   Rancour, Jeannett Senior, MD  Naphazoline-Pheniramine (ALLERGY EYE OP) Place 1 drop into both eyes daily as needed (red/itchy eyes).    [provider]  ondansetron (ZOFRAN) 4 MG tablet Take 1 tablet (4 mg total) by mouth every 8 (eight) hours as needed for nausea or vomiting. 12/26/21   Leroy Sea, MD  pantoprazole (PROTONIX) 40 MG tablet Take 1 tablet (40 mg total) by mouth daily. 01/22/22   Tomi Bamberger, PA-C  polyethylene glycol powder (GLYCOLAX/MIRALAX) 17 GM/SCOOP powder Take 1 capful with water (17 g) by mouth daily. 10/25/21   Rolly Salter, MD  sucralfate (CARAFATE) 1 g tablet Take 1 tablet (1 g total) by mouth 2 (two) times daily for 21 days. 01/22/22 02/14/22  Tomi Bamberger, PA-C      Allergies    Fish allergy, Flagyl [metronidazole], Iodine, Latex, Oxycodone, Shellfish allergy, and Strawberry flavor    Review of Systems   Review of Systems  Cardiovascular:  Positive for chest pain.  All other systems reviewed and are negative.   Physical Exam Updated Vital Signs BP (!) 159/104 (BP Location: Right Arm)   Pulse 83   Temp 98.9 F (37.2 C)   Resp 16   Ht  5\' 9"  (1.753 m)   Wt 63.5 kg   SpO2 100%   BMI 20.67 kg/m  Physical Exam Vitals and nursing note reviewed.  Constitutional:      General: She is not in acute distress.    Appearance: She is well-developed.  HENT:     Head: Atraumatic.  Eyes:     Conjunctiva/sclera: Conjunctivae normal.  Cardiovascular:     Rate and Rhythm: Normal rate and regular rhythm.     Pulses: Normal pulses.     Heart sounds: Normal heart sounds.  Pulmonary:     Effort: Pulmonary effort is normal.     Breath sounds: No wheezing, rhonchi or rales.  Chest:     Chest wall: Tenderness (Tenderness to palpation of chest wall and upper back wall  on palpation without any overlying skin changes.) present.  Abdominal:     Palpations: Abdomen is soft.     Tenderness: There is no abdominal tenderness.  Musculoskeletal:     Cervical back: Neck supple.     Right lower leg: No edema.     Left lower leg: No edema.  Skin:    Findings: No rash.  Neurological:     Mental Status: She is alert.  Psychiatric:        Mood and Affect: Mood normal.     ED Results / Procedures / Treatments   Labs (all labs ordered are listed, but only abnormal results are displayed) Labs Reviewed  BASIC METABOLIC PANEL - Abnormal; Notable for the following components:      Result Value   CO2 19 (*)    Glucose, Bld 108 (*)    All other components within normal limits  CBC - Abnormal; Notable for the following components:   WBC 13.6 (*)    All other components within normal limits  HCG, SERUM, QUALITATIVE  TROPONIN I (HIGH SENSITIVITY)  TROPONIN I (HIGH SENSITIVITY)    EKG EKG Interpretation Date/Time:  Wednesday February 26 2023 05:53:45 EDT Ventricular Rate:  85 PR Interval:  180 QRS Duration:  76 QT Interval:  384 QTC Calculation: 456 R Axis:   126  Text Interpretation: Normal sinus rhythm Right axis deviation When compared with ECG of 21-Oct-2021 02:46, PREVIOUS ECG IS PRESENT Confirmed by Virgina Norfolk [phone]) on 02/26/2023 9:17:22 AM  Radiology DG Chest 2 View  Result Date: 02/26/2023 CLINICAL DATA:  Chest pain. EXAM: CHEST - 2 VIEW COMPARISON:  Chest CT no contrast 01/21/2023. FINDINGS: The lungs are mildly emphysematous but clear. No pleural effusion is seen. The cardiomediastinal silhouette and vascular pattern are normal. Thoracic cage grossly intact. IMPRESSION: No evidence of acute chest disease.  Stable COPD chest. Electronically Signed   By: Almira Bar M.D.   On: 02/26/2023 06:49    Procedures Procedures    Medications Ordered in ED Medications  ketorolac (TORADOL) 15 MG/ML injection 15 mg (15 mg Intravenous Given 02/26/23  0957)    ED Course/ Medical Decision Making/ A&P                                 Medical Decision Making Amount and/or Complexity of Data Reviewed Labs: ordered. Radiology: ordered.  Risk Prescription drug management.   BP (!) 139/91 (BP Location: Left Arm)   Pulse 78   Temp 98.4 F (36.9 C) (Oral)   Resp 16   Ht 5\' 9"  (1.753 m)   Wt 63.5 kg   SpO2 98%  BMI 20.67 kg/m   6:46 AM  42 year old female significant history of asthma, heart murmur, alcohol abuse and polysubstance use presenting with complaints of chest pain.  Patient report for the past 24 hours she has been noticing pain to the chest.  She described pain as a sharp aching sensation in her chest wraps around to her back and worse with movements.  Pain is moderate in severity.  She did endorse some shortness of breath and some wheezing initially and she used her inhaler thinking that it was an asthma attack.  She mention it did not provide much relief.  She does not endorse any fever or chills no runny nose sneezing or coughing no nausea vomiting or diarrhea no abdominal pain no lightheadedness or dizziness or diaphoresis.  She admits to alcohol and tobacco use but denies any cocaine use.  She did recall playing [place] [employer] several days prior and did quite a few swings and unsure if it contribute to her symptoms.  On exam, patient is nontoxic.  She does have reproducible tenderness to her upper back and chest wall on palpation without any overlying skin changes.  Heart with normal rate and rhythm, lungs clear to auscultation no wheezes rales or rhonchi heard abdomen is soft nontender.  Intact radial pulse bilaterally.  Vital sign reviewed overall reassuring no fever no hypoxia.  -Labs ordered, independently viewed and interpreted by me.  Labs remarkable for WBC 13.6, nonspecific.  Normal troponin, will obtain delta trop. -The patient was maintained on a cardiac monitor.  I personally viewed and interpreted the cardiac  monitored which showed an underlying rhythm of: NSR -Imaging independently viewed and interpreted by me and I agree with radiologist's interpretation.  Result remarkable for CXR without concerning finding -This patient presents to the ED for concern of CP, this involves an extensive number of treatment options, and is a complaint that carries with it a high risk of complications and morbidity.  The differential diagnosis includes MSK pain, ACS, PNA, asthma exacerbation, COPD, GERD, gastritis, shingles -Co morbidities that complicate the patient evaluation includes asthma, polysubstance use -Treatment includes toradol -Reevaluation of the patient after these medicines showed that the patient improved -PCP office notes or outside notes reviewed -Escalation to admission/observation considered: patients feels much better, is comfortable with discharge, and will follow up with PCP -Prescription medication considered, patient comfortable with NSAIDs, muscle relaxant -Social Determinant of Health considered         Final Clinical Impression(s) / ED Diagnoses Final diagnoses:  Nonspecific chest pain    Rx / DC Orders ED Discharge Orders          Ordered    ibuprofen (ADVIL) 600 MG tablet  Every 6 hours PRN        02/26/23 1200    cyclobenzaprine (FLEXERIL) 10 MG tablet  2 times daily PRN        02/26/23 1200              Fayrene Helper, PA-C 02/26/23 1202    Virgina Norfolk, DO 02/26/23 1426

## 2023-02-26 NOTE — Discharge Instructions (Addendum)
Your pain is likely musculoskeletal in origin.  It is likely muscle pain.  Take medications prescribed.  Return if you notice no improvement

## 2023-02-26 NOTE — ED Triage Notes (Signed)
Pt arrives to ED c/o right sided chest pain that radiates down right side x 1 day. Pt endorses SOB and cough but denies N/V. Pt with hx of asthma

## 2023-12-08 DIAGNOSIS — D539 Nutritional anemia, unspecified: Secondary | ICD-10-CM | POA: Diagnosis not present

## 2023-12-08 DIAGNOSIS — Z6822 Body mass index (BMI) 22.0-22.9, adult: Secondary | ICD-10-CM | POA: Diagnosis not present

## 2023-12-08 DIAGNOSIS — Z23 Encounter for immunization: Secondary | ICD-10-CM | POA: Diagnosis not present

## 2023-12-08 DIAGNOSIS — E559 Vitamin D deficiency, unspecified: Secondary | ICD-10-CM | POA: Diagnosis not present

## 2023-12-08 DIAGNOSIS — Z0000 Encounter for general adult medical examination without abnormal findings: Secondary | ICD-10-CM | POA: Diagnosis not present

## 2023-12-08 DIAGNOSIS — Z1159 Encounter for screening for other viral diseases: Secondary | ICD-10-CM | POA: Diagnosis not present

## 2023-12-08 DIAGNOSIS — R5383 Other fatigue: Secondary | ICD-10-CM | POA: Diagnosis not present

## 2023-12-08 DIAGNOSIS — Z7251 High risk heterosexual behavior: Secondary | ICD-10-CM | POA: Diagnosis not present

## 2023-12-08 DIAGNOSIS — R030 Elevated blood-pressure reading, without diagnosis of hypertension: Secondary | ICD-10-CM | POA: Diagnosis not present

## 2023-12-08 DIAGNOSIS — R87610 Atypical squamous cells of undetermined significance on cytologic smear of cervix (ASC-US): Secondary | ICD-10-CM | POA: Diagnosis not present

## 2023-12-22 DIAGNOSIS — Z79890 Hormone replacement therapy (postmenopausal): Secondary | ICD-10-CM | POA: Diagnosis not present

## 2023-12-22 DIAGNOSIS — Z1231 Encounter for screening mammogram for malignant neoplasm of breast: Secondary | ICD-10-CM | POA: Diagnosis not present

## 2023-12-22 DIAGNOSIS — E559 Vitamin D deficiency, unspecified: Secondary | ICD-10-CM | POA: Diagnosis not present

## 2023-12-22 DIAGNOSIS — K769 Liver disease, unspecified: Secondary | ICD-10-CM | POA: Diagnosis not present

## 2023-12-22 DIAGNOSIS — Z6822 Body mass index (BMI) 22.0-22.9, adult: Secondary | ICD-10-CM | POA: Diagnosis not present

## 2023-12-22 DIAGNOSIS — D529 Folate deficiency anemia, unspecified: Secondary | ICD-10-CM | POA: Diagnosis not present

## 2024-01-02 DIAGNOSIS — Z1231 Encounter for screening mammogram for malignant neoplasm of breast: Secondary | ICD-10-CM | POA: Diagnosis not present

## 2024-01-12 DIAGNOSIS — R922 Inconclusive mammogram: Secondary | ICD-10-CM | POA: Diagnosis not present

## 2024-01-12 DIAGNOSIS — R928 Other abnormal and inconclusive findings on diagnostic imaging of breast: Secondary | ICD-10-CM | POA: Diagnosis not present

## 2024-01-12 DIAGNOSIS — N6324 Unspecified lump in the left breast, lower inner quadrant: Secondary | ICD-10-CM | POA: Diagnosis not present

## 2024-05-10 ENCOUNTER — Encounter: Payer: Self-pay | Admitting: Obstetrics and Gynecology

## 2024-05-10 ENCOUNTER — Other Ambulatory Visit (HOSPITAL_COMMUNITY): Payer: Self-pay

## 2024-05-10 ENCOUNTER — Ambulatory Visit (INDEPENDENT_AMBULATORY_CARE_PROVIDER_SITE_OTHER): Admitting: Obstetrics and Gynecology

## 2024-05-10 ENCOUNTER — Other Ambulatory Visit (HOSPITAL_COMMUNITY)
Admission: RE | Admit: 2024-05-10 | Discharge: 2024-05-10 | Disposition: A | Discharge status: Home / Self Care | Source: Ambulatory Visit | Attending: Obstetrics and Gynecology | Admitting: Obstetrics and Gynecology

## 2024-05-10 ENCOUNTER — Other Ambulatory Visit: Payer: Self-pay

## 2024-05-10 VITALS — BP 135/93 | HR 79 | Wt 152.4 lb

## 2024-05-10 DIAGNOSIS — N898 Other specified noninflammatory disorders of vagina: Secondary | ICD-10-CM

## 2024-05-10 DIAGNOSIS — N939 Abnormal uterine and vaginal bleeding, unspecified: Secondary | ICD-10-CM | POA: Insufficient documentation

## 2024-05-10 DIAGNOSIS — N951 Menopausal and female climacteric states: Secondary | ICD-10-CM | POA: Diagnosis not present

## 2024-05-10 DIAGNOSIS — I10 Essential (primary) hypertension: Secondary | ICD-10-CM | POA: Insufficient documentation

## 2024-05-10 HISTORY — PX: ENDOMETRIAL BIOPSY: PRO73

## 2024-05-10 MED ORDER — CLINDAMYCIN PHOSPHATE (1 DOSE) 2 % VA CREA
1.0000 | TOPICAL_CREAM | Freq: Once | VAGINAL | 0 refills | Status: AC
  Filled 2024-05-10: qty 5, 1d supply, fill #0

## 2024-05-10 NOTE — Progress Notes (Signed)
 Obstetrics and Gynecology New Patient Evaluation  Appointment Date: 05/10/2024  OB[facility]N Clinic: [facility]  Primary Care Provider: [facility]   Referring Provider: [facility]  Chief Complaint: AUB, hot flashes, night sweats  History of Present Illness: Natalie [name] is a 43 y.o.  [phone] (Patient's last menstrual period was 04/26/2024 (exact date).), seen for the above chief complaint. Her past medical history is significant for HTN, tobacco, h/o USO with other tube also removed.   Patient states that for about the past year she's had AUB and hot flashes and night sweats. Prior to this, her periods were qmonth, regular, 3-5 days. For the past year, she had climateric s/s and also times where she may skip a month or have a period and then another one a week or two later. Seen July at her PCP and had a 7/14 ascus/hpv+ pap, negative gc/ct/tsh/cbc testing (reviewed on her phone).  She denies any vaginal s/s.   Review of Systems: Pertinent items are noted in HPI.   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   Alcohol abuse 10/20/2021   Alcoholic hepatitis (HCC) 10/20/2021   Fatty liver 10/20/2021   Past Medical History:  Past Medical History:  Diagnosis Date   Asthma    Ectopic pregnancy    Heart murmur    Pancreatitis    Past Surgical History:  Past Surgical History:  Procedure Laterality Date   BIOPSY  12/26/2021   Procedure: BIOPSY;  Surgeon: Wilhelmenia Aloha Raddle., MD;  Location: [facility] ENDOSCOPY;  Service: Gastroenterology;;   ESOPHAGOGASTRODUODENOSCOPY (EGD) WITH PROPOFOL  N/A 12/26/2021   Procedure: ESOPHAGOGASTRODUODENOSCOPY (EGD) WITH PROPOFOL ;   Surgeon: Wilhelmenia Aloha Raddle., MD;  Location: [facility] ENDOSCOPY;  Service: Gastroenterology;  Laterality: N/A;   TUBAL LIGATION  2007   [place]. postpartum   Past Obstetrical History:  OB History  Gravida Para Term Preterm AB Living  8 4 4  4 4   SAB IAB Ectopic Multiple Live Births  1  3  4     # Outcome Date GA Lbr Len/2nd Weight Sex Type Anes PTL Lv  8 SAB           7 Ectopic           6 Ectopic           5 Ectopic           4 Term           3 Term           2 Term           1 Term             Obstetric Comments  SVD x 4  Ectopic x 3 (no tubes left) all after BTL   Past Gynecological History: As per HPI.  Social History:  Social History   Socioeconomic History   Marital status: Divorced    Spouse name: Not on file   Number of children: Not on file   Years of education: Not on file   Highest education level: Not on file  Occupational History   Occupation: home health aide  Tobacco Use   Smoking status: Every Day    Current packs/day: 0.50    Types: Cigarettes  Smokeless tobacco: Never  Vaping Use   Vaping status: Never Used  Substance and Sexual Activity   Alcohol use: Not Currently    Comment: quit two months ago per 12/24/21   Drug use: Yes    Types: Marijuana    Comment: daily   Sexual activity: Not on file  Other Topics Concern   Not on file  Social History Narrative   Not on file   Social Drivers of Health   Tobacco Use: High Risk (05/10/2024)   Patient History    Smoking Tobacco Use: Every Day    Smokeless Tobacco Use: Never    Passive Exposure: Not on file  Financial Resource Strain: Not on file  Food Insecurity: Not on file  Transportation Needs: Not on file  Physical Activity: Not on file  Stress: Not on file  Social Connections: Not on file  Intimate Partner Violence: Not on file  Depression (PHQ2-9): Low Risk (11/02/2021)   Depression (PHQ2-9)    PHQ-2 Score: 0  Alcohol Screen: Not on file  Housing: Not on file  Utilities: Not on file   Health Literacy: Not on file   Family History:  Family History  Problem Relation Age of Onset   Heart murmur Mother    Arthritis Mother    Arthritis Father    Heart murmur Father    Depression Maternal Aunt    Cancer Paternal Grandfather     Medications Nathanel RONAL Brandy had no medications administered during this visit. Current Outpatient Medications  Medication Sig Dispense Refill   albuterol  (VENTOLIN  HFA) 108 (90 Base) MCG/ACT inhaler Inhale 2 puffs into the lungs as needed for wheezing or shortness of breath.     cyclobenzaprine  (FLEXERIL ) 10 MG tablet Take 1 tablet (10 mg total) by mouth 2 (two) times daily as needed for muscle spasms. 20 tablet 0   diphenhydrAMINE  (BENADRYL  ALLERGY) 25 MG tablet Take 50 mg by mouth every 4 (four) hours as needed for allergies.     ibuprofen  (ADVIL ) 600 MG tablet Take 1 tablet (600 mg total) by mouth every 6 (six) hours as needed. 30 tablet 0   methocarbamol  (ROBAXIN ) 500 MG tablet Take 1 tablet (500 mg total) by mouth every 8 (eight) hours as needed for muscle spasms. 20 tablet 0   Naphazoline-Pheniramine (ALLERGY EYE OP) Place 2 drops into both eyes as needed (red/itchy eyes).     polyethylene glycol powder (GLYCOLAX /MIRALAX ) 17 GM/SCOOP powder Take 1 capful with water (17 g) by mouth daily. (Patient not taking: Reported on 05/10/2024) 238 g 0   No current facility-administered medications for this visit.   Allergies Fish allergy, Flagyl [metronidazole], Iodine, Oxycodone , Shellfish allergy, Latex, and Strawberry flavoring agent (non-screening)  Physical Exam:  BP (!) 135/93   Pulse 79   Wt 152 lb 6.4 oz (69.1 kg)   LMP 04/26/2024 (Exact Date)   BMI 22.51 kg/m  Body mass index is 22.51 kg/m. General appearance: Well nourished, well developed female in no acute distress.  Cardiovascular: normal s1 and s2.  No murmurs, rubs or gallops. Respiratory:  Clear to auscultation bilateral. Normal respiratory effort Abdomen: positive bowel  sounds and no masses, hernias; diffusely non tender to palpation, non distended Neuro/Psych:  Normal mood and affect.  Skin:  Warm and dry.  Lymphatic:  No inguinal lymphadenopathy.   Cervical exam performed in the presence of a chaperone Pelvic exam: is not limited by body habitus EGBUS: within normal limits Vagina: within normal limits, normal white d/c with odor Cervix:  normal appearing cervix without tenderness, discharge or lesions. Uterus:  nonenlarged and non tender Adnexa:  normal adnexa and no mass, fullness, tenderness Rectovaginal: deferred  Laboratory: as per HPI  Radiology: none  Assessment: patient stable  Plan:  1. Abnormal uterine bleeding (AUB) (Primary) D/w her need to assess for any structural causes, hormonal cause with embx and u/s, which she is amenable to. I told her that most likely peri menopause. I said can talk to her more about potential HRT but potential transdermal candidate.  - Surgical pathology( [place]/ [facility]) - US  PELVIC COMPLETE WITH TRANSVAGINAL; Future  2. Perimenopausal vasomotor symptoms See above  Orders Placed This Encounter  Procedures   US  PELVIC COMPLETE WITH TRANSVAGINAL    RTC 3wks after u/s   Bebe Izell Raddle MD Attending [facility])

## 2024-05-10 NOTE — Procedures (Signed)
 Endometrial Biopsy Procedure Note  Pre-operative Diagnosis: AUB. Perimenopause  Post-operative Diagnosis: normal  Procedure Details  Cervical exam performed in the presence of a chaperone Urine pregnancy test was not done.  The risks (including infection, bleeding, pain, and uterine perforation) and benefits of the procedure were explained to the patient and Written informed consent was obtained.  The patient was placed in the dorsal lithotomy position.  Bimanual exam showed the uterus to be in the neutral position.  A Graves' speculum inserted in the vagina, and the cervix was visualized. The cervix was then prepped with povidone iodine, and a pipelle was inserted into the uterine cavity and sounded the uterus to a depth of 6.5cm.  A Small amount of tissue was collected after 2 passes. The sample was sent for pathologic examination.  Condition: Stable  Complications: None  Plan: The patient was advised to call for any fever or for prolonged or severe pain or bleeding. She was advised to use OTC analgesics as needed for mild to moderate pain. She was advised to avoid vaginal intercourse for 48 hours or until the bleeding has completely stopped.  Bebe Izell Raddle MD Attending [facility])

## 2024-05-10 NOTE — Progress Notes (Signed)
 error

## 2024-05-11 ENCOUNTER — Other Ambulatory Visit (HOSPITAL_COMMUNITY): Payer: Self-pay

## 2024-05-12 LAB — SURGICAL PATHOLOGY

## 2024-05-14 ENCOUNTER — Other Ambulatory Visit (HOSPITAL_COMMUNITY)

## 2024-05-15 ENCOUNTER — Ambulatory Visit: Payer: Self-pay | Admitting: Obstetrics and Gynecology

## 2024-05-21 ENCOUNTER — Other Ambulatory Visit (HOSPITAL_COMMUNITY): Payer: Self-pay

## 2024-05-24 ENCOUNTER — Ambulatory Visit (HOSPITAL_COMMUNITY)
Admission: RE | Admit: 2024-05-24 | Discharge: 2024-05-24 | Disposition: A | Discharge status: Home / Self Care | Source: Ambulatory Visit | Attending: Obstetrics and Gynecology | Admitting: Obstetrics and Gynecology

## 2024-05-24 DIAGNOSIS — N939 Abnormal uterine and vaginal bleeding, unspecified: Secondary | ICD-10-CM | POA: Diagnosis present

## 2024-05-31 ENCOUNTER — Other Ambulatory Visit (HOSPITAL_COMMUNITY)
Admission: RE | Admit: 2024-05-31 | Discharge: 2024-05-31 | Disposition: A | Source: Ambulatory Visit | Attending: Obstetrics and Gynecology | Admitting: Obstetrics and Gynecology

## 2024-05-31 ENCOUNTER — Encounter: Payer: Self-pay | Admitting: Obstetrics and Gynecology

## 2024-05-31 ENCOUNTER — Ambulatory Visit (INDEPENDENT_AMBULATORY_CARE_PROVIDER_SITE_OTHER): Payer: Self-pay | Admitting: Obstetrics and Gynecology

## 2024-05-31 ENCOUNTER — Other Ambulatory Visit (HOSPITAL_COMMUNITY)
Admission: RE | Admit: 2024-05-31 | Discharge: 2024-05-31 | Disposition: A | Discharge status: Home / Self Care | Source: Ambulatory Visit | Attending: Obstetrics and Gynecology | Admitting: Obstetrics and Gynecology

## 2024-05-31 VITALS — BP 152/119 | HR 83 | Ht 69.0 in | Wt 153.6 lb

## 2024-05-31 DIAGNOSIS — N898 Other specified noninflammatory disorders of vagina: Secondary | ICD-10-CM | POA: Diagnosis present

## 2024-05-31 DIAGNOSIS — N939 Abnormal uterine and vaginal bleeding, unspecified: Secondary | ICD-10-CM | POA: Diagnosis not present

## 2024-05-31 DIAGNOSIS — N879 Dysplasia of cervix uteri, unspecified: Secondary | ICD-10-CM | POA: Diagnosis present

## 2024-05-31 DIAGNOSIS — Z202 Contact with and (suspected) exposure to infections with a predominantly sexual mode of transmission: Secondary | ICD-10-CM

## 2024-05-31 HISTORY — PX: COLPOSCOPY: PRO47

## 2024-05-31 NOTE — Procedures (Signed)
 Colposcopy Procedure Note  Pre-operative Diagnosis: 12/08/2023 ascus/HPV+ (no subtyping done)  Post-operative Diagnosis: CIN 1  Procedure Details  Cervical exam performed in the presence of a chaperone The risks (including infection, bleeding, pain) and benefits of the procedure were explained to the patient and written informed consent was obtained.  The patient was placed in the dorsal lithotomy position. A Graves was speculum inserted in the vagina, and the cervix was visualized.  Acetic acid staining was done and the cervix was viewed with green filter; lugol's staining with green filter was also done.  Biopsy 6 and 12 o'clock and then single toothed tenaculum applied and endocervical curettage in all four quadrants done. There was no bleeding after procedure.   Findings: diffuse AWE changes circumferentially  Adequate: Yes  Specimens: 6 and 12 o'clock (sent together), endocervical curettage  Condition: Stable  Complications: None  Plan:  The patient was advised to call for any fever or for prolonged or severe pain or bleeding. She was advised to use OTC analgesics as needed for mild to moderate pain. She was advised to avoid vaginal intercourse for 48 hours or until the bleeding has completely stopped.   Bebe Izell Raddle MD Attending [facility])

## 2024-05-31 NOTE — Progress Notes (Signed)
 Obstetrics and Gynecology Visit Return Patient Evaluation  Appointment Date: 05/31/2024  Primary Care Provider: Patient, No Pcp Per  OBGYN Clinic: [facility]   Chief Complaint: u/s follow up  History of Present Illness:  Natalie [name] is a 44 y.o. with above CC.   Interval History: Since that time, she states that she had a period about a week prior to her u/s. U/s not read yet but looks negative on my review. Embx from last visit negative: fragments of benign proliferative endometrium. No CV, neuro or pulmonary s/s. Pt still with vaginal dryness but no VB or spotting.   Review of Systems: as noted in the History of Present Illness.  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 Current Outpatient Medications  Medication Sig Dispense Refill   albuterol  (VENTOLIN  HFA) 108 (90 Base) MCG/ACT inhaler Inhale 2 puffs into the lungs as needed for wheezing or shortness of breath.     diphenhydrAMINE  (BENADRYL  ALLERGY) 25 MG tablet Take 50 mg by mouth every 4 (four) hours as needed for allergies.     ibuprofen  (ADVIL ) 600 MG tablet Take 1 tablet (600 mg total) by mouth every 6 (six) hours as needed. 30 tablet 0   methocarbamol  (ROBAXIN ) 500 MG tablet Take 1 tablet (500 mg total) by mouth every 8 (eight) hours as needed for muscle spasms. 20 tablet 0   Naphazoline-Pheniramine (ALLERGY EYE OP) Place 2 drops into both eyes as needed (red/itchy eyes).     cyclobenzaprine  (FLEXERIL ) 10 MG tablet Take 1 tablet  (10 mg total) by mouth 2 (two) times daily as needed for muscle spasms. 20 tablet 0   polyethylene glycol powder (GLYCOLAX /MIRALAX ) 17 GM/SCOOP powder Take 1 capful with water (17 g) by mouth daily. (Patient not taking: Reported on 05/10/2024) 238 g 0   No current facility-administered medications for this visit.    Allergies: is allergic to fish allergy, flagyl [metronidazole], iodine, oxycodone , shellfish allergy, latex, and strawberry flavoring agent (non-screening).  Physical Exam:  Vitals:   05/31/24 1049 05/31/24 1129  BP: (!) 137/112 (!) 152/119  Pulse: 83   Weight: 153 lb 9.6 oz (69.7 kg)   Height: 5' 9 (1.753 m)    General appearance: Well nourished, well developed female in no acute distress.  Abdomen: diffusely non tender to palpation, non distended, and no masses, hernias Neuro/Psych:  Normal mood and affect.    See procedure note for colpo details   Assessment: patient stable  Plan:  1. Abnormal uterine bleeding (AUB) (Primary) Options d/w her. She states she's used depo and ocps in the past and still had aub and she desires definitive therapy. I told her that she is a good mirena or ablation candidate but she desires definitive treatment with hysterectomy. The risks of surgery were discussed in detail with the patient including but not limited to: bleeding which may require transfusion or reoperation; infection which may require prolonged hospitalization or re-hospitalization and antibiotic therapy; injury to bowel, bladder, ureters and major vessels or other surrounding organs which may lead to other procedures; formation of  adhesions; need for additional procedures including laparotomy or subsequent procedures secondary to intraoperative injury or abnormal pathology; thromboembolic phenomenon; incisional problems and other postoperative or anesthesia complications.  Patient was told that the likelihood that her condition and symptoms will be treated effectively with this  surgical management was very high; the postoperative expectations were also discussed in detail. The patient also understands the alternative treatment options which were discussed in full. All questions were answered.    I told her that she needs to contact her pcp asap given her BPs and that her surgery can't be scheduled unless her BPs are under better control.   2. Vaginal discharge - Cervicovaginal ancillary only( [place])  3. Vaginal dryness - Cervicovaginal ancillary only( [place])  4. STD exposure - Cervicovaginal ancillary only( [place])  5. Cervical dysplasia - Surgical pathology( [place]/ [facility])  Return in about 10 weeks (around 08/09/2024) for in person, with dr izell.  Future Appointments  Date Time Provider Department Center  07/29/2024  3:35 PM Izell Harari, MD [facility]    Harari Izell Raddle MD Attending [facility] for [facility])

## 2024-06-01 LAB — CERVICOVAGINAL ANCILLARY ONLY
Bacterial Vaginitis (gardnerella): POSITIVE — AB
Candida Glabrata: NEGATIVE
Candida Vaginitis: NEGATIVE
Chlamydia: NEGATIVE
Comment: NEGATIVE
Comment: NEGATIVE
Comment: NEGATIVE
Comment: NEGATIVE
Comment: NEGATIVE
Comment: NORMAL
Neisseria Gonorrhea: NEGATIVE
Trichomonas: NEGATIVE

## 2024-06-02 ENCOUNTER — Encounter: Payer: Self-pay | Admitting: *Deleted

## 2024-06-03 ENCOUNTER — Encounter: Payer: Self-pay | Admitting: Obstetrics and Gynecology

## 2024-06-03 ENCOUNTER — Ambulatory Visit: Payer: Self-pay | Admitting: Obstetrics and Gynecology

## 2024-06-03 DIAGNOSIS — D069 Carcinoma in situ of cervix, unspecified: Secondary | ICD-10-CM | POA: Insufficient documentation

## 2024-06-03 LAB — SURGICAL PATHOLOGY

## 2024-06-07 ENCOUNTER — Telehealth: Payer: Self-pay | Admitting: Family Medicine

## 2024-06-07 NOTE — Telephone Encounter (Signed)
 Called pt and left detailed message about the change in time of her upcoming appointment so that she can have the LEEP procedure done on the same day. Left office number for pt to call if she needs to reschedule.

## 2024-07-20 ENCOUNTER — Ambulatory Visit: Admitting: Obstetrics and Gynecology

## 2024-07-29 ENCOUNTER — Ambulatory Visit: Payer: Self-pay | Admitting: Obstetrics and Gynecology
